# Patient Record
Sex: Male | Born: 1965 | Race: White | Hispanic: No | Marital: Married | Smoking: Never smoker
Health system: Southern US, Community
[De-identification: ages and names within clinical notes are randomized; demographics above are authoritative.]

## PROBLEM LIST (undated history)

## (undated) DIAGNOSIS — I10 Essential (primary) hypertension: Secondary | ICD-10-CM

## (undated) DIAGNOSIS — D689 Coagulation defect, unspecified: Secondary | ICD-10-CM

## (undated) DIAGNOSIS — I82409 Acute embolism and thrombosis of unspecified deep veins of unspecified lower extremity: Secondary | ICD-10-CM

## (undated) DIAGNOSIS — S86019A Strain of unspecified Achilles tendon, initial encounter: Secondary | ICD-10-CM

## (undated) DIAGNOSIS — E785 Hyperlipidemia, unspecified: Secondary | ICD-10-CM

## (undated) DIAGNOSIS — T7840XA Allergy, unspecified, initial encounter: Secondary | ICD-10-CM

## (undated) HISTORY — DX: Coagulation defect, unspecified: D68.9

## (undated) HISTORY — PX: FOOT SURGERY: SHX648

## (undated) HISTORY — DX: Hyperlipidemia, unspecified: E78.5

## (undated) HISTORY — DX: Allergy, unspecified, initial encounter: T78.40XA

## (undated) HISTORY — DX: Essential (primary) hypertension: I10

## (undated) HISTORY — PX: ACHILLES TENDON REPAIR: SUR1153

## (undated) HISTORY — DX: Strain of unspecified achilles tendon, initial encounter: S86.019A

## (undated) HISTORY — DX: Acute embolism and thrombosis of unspecified deep veins of unspecified lower extremity: I82.409

---

## 2000-09-14 ENCOUNTER — Emergency Department (HOSPITAL_COMMUNITY): Admission: EM | Admit: 2000-09-14 | Discharge: 2000-09-14 | Payer: Self-pay | Admitting: Emergency Medicine

## 2000-09-24 ENCOUNTER — Encounter: Payer: Self-pay | Admitting: Gastroenterology

## 2000-09-24 ENCOUNTER — Ambulatory Visit (HOSPITAL_COMMUNITY): Admission: RE | Admit: 2000-09-24 | Discharge: 2000-09-24 | Payer: Self-pay | Admitting: Gastroenterology

## 2001-11-18 ENCOUNTER — Encounter: Payer: Self-pay | Admitting: Specialist

## 2001-11-18 ENCOUNTER — Observation Stay (HOSPITAL_COMMUNITY): Admission: RE | Admit: 2001-11-18 | Discharge: 2001-11-19 | Payer: Self-pay | Admitting: Specialist

## 2004-03-12 ENCOUNTER — Ambulatory Visit: Payer: Self-pay | Admitting: Internal Medicine

## 2005-07-04 ENCOUNTER — Ambulatory Visit: Payer: Self-pay | Admitting: Internal Medicine

## 2005-08-19 ENCOUNTER — Ambulatory Visit: Payer: Self-pay | Admitting: Internal Medicine

## 2005-11-21 ENCOUNTER — Ambulatory Visit: Payer: Self-pay | Admitting: Internal Medicine

## 2007-01-30 ENCOUNTER — Ambulatory Visit: Payer: Self-pay | Admitting: Internal Medicine

## 2008-02-10 DIAGNOSIS — E785 Hyperlipidemia, unspecified: Secondary | ICD-10-CM | POA: Insufficient documentation

## 2008-02-10 DIAGNOSIS — J4540 Moderate persistent asthma, uncomplicated: Secondary | ICD-10-CM | POA: Insufficient documentation

## 2008-02-11 ENCOUNTER — Ambulatory Visit: Payer: Self-pay | Admitting: Internal Medicine

## 2008-03-11 ENCOUNTER — Ambulatory Visit: Payer: Self-pay | Admitting: Pulmonary Disease

## 2008-03-11 ENCOUNTER — Telehealth (INDEPENDENT_AMBULATORY_CARE_PROVIDER_SITE_OTHER): Payer: Self-pay | Admitting: *Deleted

## 2008-03-11 DIAGNOSIS — J111 Influenza due to unidentified influenza virus with other respiratory manifestations: Secondary | ICD-10-CM | POA: Insufficient documentation

## 2008-03-14 ENCOUNTER — Telehealth (INDEPENDENT_AMBULATORY_CARE_PROVIDER_SITE_OTHER): Payer: Self-pay | Admitting: *Deleted

## 2008-03-14 ENCOUNTER — Ambulatory Visit: Payer: Self-pay | Admitting: Internal Medicine

## 2008-03-14 DIAGNOSIS — J069 Acute upper respiratory infection, unspecified: Secondary | ICD-10-CM | POA: Insufficient documentation

## 2008-03-17 ENCOUNTER — Telehealth (INDEPENDENT_AMBULATORY_CARE_PROVIDER_SITE_OTHER): Payer: Self-pay | Admitting: *Deleted

## 2008-07-28 DIAGNOSIS — S86019A Strain of unspecified Achilles tendon, initial encounter: Secondary | ICD-10-CM

## 2008-07-28 HISTORY — DX: Strain of unspecified achilles tendon, initial encounter: S86.019A

## 2008-08-17 ENCOUNTER — Ambulatory Visit (HOSPITAL_COMMUNITY): Admission: RE | Admit: 2008-08-17 | Discharge: 2008-08-18 | Payer: Self-pay | Admitting: Orthopedic Surgery

## 2008-08-25 ENCOUNTER — Ambulatory Visit: Payer: Self-pay | Admitting: Internal Medicine

## 2008-08-25 ENCOUNTER — Telehealth (INDEPENDENT_AMBULATORY_CARE_PROVIDER_SITE_OTHER): Payer: Self-pay | Admitting: *Deleted

## 2008-08-29 LAB — CONVERTED CEMR LAB
INR: 2.4 — ABNORMAL HIGH (ref 0.8–1.0)
Prothrombin Time: 24.9 s — ABNORMAL HIGH (ref 10.9–13.3)

## 2008-09-01 ENCOUNTER — Ambulatory Visit: Payer: Self-pay | Admitting: Internal Medicine

## 2008-09-01 LAB — CONVERTED CEMR LAB
INR: 3.4 — ABNORMAL HIGH (ref 0.8–1.0)
Prothrombin Time: 34.2 s — ABNORMAL HIGH (ref 10.9–13.3)

## 2008-09-08 ENCOUNTER — Ambulatory Visit: Payer: Self-pay | Admitting: Internal Medicine

## 2008-09-09 LAB — CONVERTED CEMR LAB
INR: 4 — ABNORMAL HIGH (ref 0.8–1.0)
Prothrombin Time: 39.9 s — ABNORMAL HIGH (ref 10.9–13.3)

## 2008-09-13 ENCOUNTER — Encounter (INDEPENDENT_AMBULATORY_CARE_PROVIDER_SITE_OTHER): Payer: Self-pay | Admitting: *Deleted

## 2008-09-13 ENCOUNTER — Ambulatory Visit: Payer: Self-pay | Admitting: Internal Medicine

## 2008-09-13 LAB — CONVERTED CEMR LAB
INR: 1.1 — ABNORMAL HIGH (ref 0.8–1.0)
Prothrombin Time: 11.5 s (ref 10.9–13.3)

## 2008-10-27 DIAGNOSIS — I82409 Acute embolism and thrombosis of unspecified deep veins of unspecified lower extremity: Secondary | ICD-10-CM

## 2008-10-27 HISTORY — DX: Acute embolism and thrombosis of unspecified deep veins of unspecified lower extremity: I82.409

## 2008-11-14 ENCOUNTER — Encounter: Payer: Self-pay | Admitting: Internal Medicine

## 2008-11-14 ENCOUNTER — Ambulatory Visit: Payer: Self-pay | Admitting: Cardiology

## 2008-11-14 ENCOUNTER — Ambulatory Visit: Payer: Self-pay | Admitting: Vascular Surgery

## 2008-11-14 ENCOUNTER — Encounter (INDEPENDENT_AMBULATORY_CARE_PROVIDER_SITE_OTHER): Payer: Self-pay | Admitting: Orthopedic Surgery

## 2008-11-14 ENCOUNTER — Ambulatory Visit (HOSPITAL_COMMUNITY): Admission: RE | Admit: 2008-11-14 | Discharge: 2008-11-14 | Payer: Self-pay | Admitting: Orthopedic Surgery

## 2008-11-14 ENCOUNTER — Encounter (INDEPENDENT_AMBULATORY_CARE_PROVIDER_SITE_OTHER): Payer: Self-pay | Admitting: *Deleted

## 2008-11-14 LAB — CONVERTED CEMR LAB
POC INR: 0.9
Prothrombin Time: 11.6 s

## 2008-11-15 ENCOUNTER — Telehealth: Payer: Self-pay | Admitting: Internal Medicine

## 2008-11-17 ENCOUNTER — Ambulatory Visit: Payer: Self-pay | Admitting: *Deleted

## 2008-11-17 ENCOUNTER — Ambulatory Visit: Payer: Self-pay | Admitting: Internal Medicine

## 2008-11-21 ENCOUNTER — Ambulatory Visit: Payer: Self-pay | Admitting: Cardiology

## 2008-11-21 LAB — CONVERTED CEMR LAB: Prothrombin Time: 15 s

## 2008-11-24 ENCOUNTER — Telehealth: Payer: Self-pay | Admitting: Internal Medicine

## 2008-11-25 ENCOUNTER — Ambulatory Visit: Payer: Self-pay | Admitting: Cardiology

## 2008-12-02 ENCOUNTER — Ambulatory Visit: Payer: Self-pay | Admitting: Cardiology

## 2008-12-02 LAB — CONVERTED CEMR LAB
POC INR: 3.6
Prothrombin Time: 22.9 s

## 2008-12-09 ENCOUNTER — Encounter (INDEPENDENT_AMBULATORY_CARE_PROVIDER_SITE_OTHER): Payer: Self-pay | Admitting: Cardiology

## 2008-12-09 ENCOUNTER — Ambulatory Visit: Payer: Self-pay | Admitting: Internal Medicine

## 2008-12-09 LAB — CONVERTED CEMR LAB: POC INR: 2.1

## 2008-12-23 ENCOUNTER — Ambulatory Visit: Payer: Self-pay | Admitting: Internal Medicine

## 2008-12-23 LAB — CONVERTED CEMR LAB: POC INR: 2.7

## 2009-01-20 ENCOUNTER — Telehealth: Payer: Self-pay | Admitting: Internal Medicine

## 2009-01-20 ENCOUNTER — Ambulatory Visit: Payer: Self-pay | Admitting: Internal Medicine

## 2009-02-17 ENCOUNTER — Ambulatory Visit: Payer: Self-pay | Admitting: Internal Medicine

## 2009-03-17 ENCOUNTER — Ambulatory Visit: Payer: Self-pay | Admitting: Cardiology

## 2009-03-17 LAB — CONVERTED CEMR LAB: POC INR: 1.8

## 2009-03-21 ENCOUNTER — Telehealth (INDEPENDENT_AMBULATORY_CARE_PROVIDER_SITE_OTHER): Payer: Self-pay | Admitting: *Deleted

## 2009-03-21 ENCOUNTER — Ambulatory Visit: Payer: Self-pay | Admitting: Internal Medicine

## 2009-03-21 DIAGNOSIS — I82409 Acute embolism and thrombosis of unspecified deep veins of unspecified lower extremity: Secondary | ICD-10-CM

## 2009-03-21 DIAGNOSIS — Z86718 Personal history of other venous thrombosis and embolism: Secondary | ICD-10-CM | POA: Insufficient documentation

## 2009-04-07 ENCOUNTER — Encounter: Payer: Self-pay | Admitting: Internal Medicine

## 2009-04-10 ENCOUNTER — Ambulatory Visit: Payer: Self-pay

## 2009-04-10 ENCOUNTER — Encounter: Payer: Self-pay | Admitting: Internal Medicine

## 2009-04-10 ENCOUNTER — Encounter (INDEPENDENT_AMBULATORY_CARE_PROVIDER_SITE_OTHER): Payer: Self-pay | Admitting: Cardiology

## 2009-04-10 ENCOUNTER — Ambulatory Visit: Payer: Self-pay | Admitting: Cardiovascular Disease

## 2009-04-12 ENCOUNTER — Telehealth (INDEPENDENT_AMBULATORY_CARE_PROVIDER_SITE_OTHER): Payer: Self-pay | Admitting: *Deleted

## 2009-04-14 ENCOUNTER — Ambulatory Visit: Payer: Self-pay | Admitting: Internal Medicine

## 2009-04-14 DIAGNOSIS — I10 Essential (primary) hypertension: Secondary | ICD-10-CM | POA: Insufficient documentation

## 2009-04-14 LAB — CONVERTED CEMR LAB
BUN: 13 mg/dL (ref 6–23)
Creatinine, Ser: 1.3 mg/dL (ref 0.4–1.5)
Eosinophils Relative: 2.5 % (ref 0.0–5.0)
GFR calc non Af Amer: 64.54 mL/min (ref >60)
Lymphocytes Relative: 34.2 % (ref 12.0–46.0)
Monocytes Relative: 8.3 % (ref 3.0–12.0)
Neutrophils Relative %: 54.5 % (ref 43.0–77.0)
Platelets: 168 10*3/uL (ref 150.0–400.0)
RBC: 5.25 M/uL (ref 4.22–5.81)
WBC: 5.1 10*3/uL (ref 4.5–10.5)

## 2009-05-08 ENCOUNTER — Encounter (INDEPENDENT_AMBULATORY_CARE_PROVIDER_SITE_OTHER): Payer: Self-pay | Admitting: Cardiology

## 2009-05-08 ENCOUNTER — Ambulatory Visit: Payer: Self-pay | Admitting: Cardiology

## 2009-05-08 LAB — CONVERTED CEMR LAB: POC INR: 2

## 2009-05-15 ENCOUNTER — Ambulatory Visit: Payer: Self-pay | Admitting: Internal Medicine

## 2009-05-15 DIAGNOSIS — R42 Dizziness and giddiness: Secondary | ICD-10-CM | POA: Insufficient documentation

## 2009-05-29 ENCOUNTER — Telehealth: Payer: Self-pay | Admitting: Internal Medicine

## 2009-06-05 ENCOUNTER — Ambulatory Visit: Payer: Self-pay | Admitting: Cardiology

## 2009-06-05 LAB — CONVERTED CEMR LAB: POC INR: 2.1

## 2009-06-26 ENCOUNTER — Ambulatory Visit: Payer: Self-pay | Admitting: Internal Medicine

## 2009-06-26 LAB — CONVERTED CEMR LAB
Chloride: 102 meq/L (ref 96–112)
Creatinine, Ser: 1.4 mg/dL (ref 0.4–1.5)
Potassium: 4 meq/L (ref 3.5–5.1)
Sodium: 137 meq/L (ref 135–145)

## 2009-07-03 ENCOUNTER — Ambulatory Visit: Payer: Self-pay | Admitting: Internal Medicine

## 2009-07-03 LAB — CONVERTED CEMR LAB: POC INR: 2

## 2009-09-15 ENCOUNTER — Ambulatory Visit: Payer: Self-pay | Admitting: Cardiology

## 2009-09-15 LAB — CONVERTED CEMR LAB: POC INR: 1.8

## 2009-10-10 ENCOUNTER — Telehealth (INDEPENDENT_AMBULATORY_CARE_PROVIDER_SITE_OTHER): Payer: Self-pay | Admitting: *Deleted

## 2009-10-10 ENCOUNTER — Ambulatory Visit: Payer: Self-pay | Admitting: Adult Health

## 2009-10-10 ENCOUNTER — Ambulatory Visit: Payer: Self-pay | Admitting: Radiology

## 2009-10-10 ENCOUNTER — Ambulatory Visit (HOSPITAL_BASED_OUTPATIENT_CLINIC_OR_DEPARTMENT_OTHER): Admission: RE | Admit: 2009-10-10 | Discharge: 2009-10-10 | Payer: Self-pay | Admitting: Internal Medicine

## 2009-10-10 DIAGNOSIS — S70919A Unspecified superficial injury of unspecified hip, initial encounter: Secondary | ICD-10-CM | POA: Insufficient documentation

## 2009-10-10 DIAGNOSIS — S90919A Unspecified superficial injury of unspecified ankle, initial encounter: Secondary | ICD-10-CM

## 2009-10-10 DIAGNOSIS — S80929A Unspecified superficial injury of unspecified lower leg, initial encounter: Secondary | ICD-10-CM

## 2009-10-10 DIAGNOSIS — S70929A Unspecified superficial injury of unspecified thigh, initial encounter: Secondary | ICD-10-CM

## 2009-10-11 ENCOUNTER — Ambulatory Visit: Payer: Self-pay | Admitting: Internal Medicine

## 2009-10-11 ENCOUNTER — Encounter: Payer: Self-pay | Admitting: Adult Health

## 2009-10-11 ENCOUNTER — Encounter: Payer: Self-pay | Admitting: Internal Medicine

## 2009-10-11 ENCOUNTER — Ambulatory Visit: Payer: Self-pay

## 2009-10-11 LAB — CONVERTED CEMR LAB
POC INR: 1.5
Prothrombin Time: 19.6 s — ABNORMAL HIGH (ref 11.6–15.2)

## 2009-10-24 ENCOUNTER — Telehealth (INDEPENDENT_AMBULATORY_CARE_PROVIDER_SITE_OTHER): Payer: Self-pay | Admitting: *Deleted

## 2009-10-24 ENCOUNTER — Ambulatory Visit: Payer: Self-pay | Admitting: Adult Health

## 2009-11-07 ENCOUNTER — Ambulatory Visit: Payer: Self-pay | Admitting: Internal Medicine

## 2009-12-18 ENCOUNTER — Ambulatory Visit: Payer: Self-pay | Admitting: Cardiology

## 2010-01-15 ENCOUNTER — Ambulatory Visit: Payer: Self-pay | Admitting: Internal Medicine

## 2010-01-15 LAB — CONVERTED CEMR LAB: POC INR: 1.3

## 2010-02-15 ENCOUNTER — Ambulatory Visit: Payer: Self-pay | Admitting: Internal Medicine

## 2010-02-15 LAB — CONVERTED CEMR LAB: POC INR: 2.5

## 2010-03-16 ENCOUNTER — Telehealth (INDEPENDENT_AMBULATORY_CARE_PROVIDER_SITE_OTHER): Payer: Self-pay | Admitting: *Deleted

## 2010-05-25 ENCOUNTER — Ambulatory Visit: Admission: RE | Admit: 2010-05-25 | Discharge: 2010-05-25 | Payer: Self-pay | Source: Home / Self Care

## 2010-05-25 LAB — CONVERTED CEMR LAB: POC INR: 2.9

## 2010-05-29 NOTE — Medication Information (Signed)
Summary: rov/tm  Anticoagulant Therapy  Managed by: Shelby Dubin, PharmD, BCPS, CPP Referring MD: wert MD, Casimiro Needle PCP: Nolon Lennert MD: Jens Som MD, Arlys John Indication 1: DVT Lab Used: [facility] [place] Site: [address] INR POC 2.0 INR RANGE 2-3  Dietary changes: no    Health status changes: yes       Details: continues to experience light headedness..seeing MW back next week.  Bleeding/hemorrhagic complications: no    Recent/future hospitalizations: no    Any changes in medication regimen? yes       Details: added diovan hct for BP  Recent/future dental: no  Any missed doses?: yes     Details: missed 1 dose 2 weeks ago.    Is patient compliant with meds? yes       Current Medications (verified): 1)  Symbicort 160-4.5 Mcg/act Aero (Budesonide-Formoterol Fumarate) .... 2 Puffs First Thing  in Am and 2 Puffs Again in Pm About 12 Hours Later 2)  Proair Hfa 108 (90 Base) Mcg/act Aers (Albuterol Sulfate) .... As Needed 3)  Coumadin 5 Mg Tabs (Warfarin Sodium) .... Take As Directed By Coumadin Clinic. 4)  Tylenol 325 Mg Tabs (Acetaminophen) .... As Needed For Headaches 5)  Viagra 50 Mg Tabs (Sildenafil Citrate) .... Take One Tab One Hour Before Activity 6)  Diovan Hct 160-12.5 Mg  Tabs (Valsartan-Hydrochlorothiazide) .... One By Mouth Daily  Allergies (verified): No Known Drug Allergies  Anticoagulation Management History:      The patient is taking warfarin and comes in today for a routine follow up visit.  Negative risk factors for bleeding include an age less than 8 years old.  The bleeding index is 'low risk'.  Positive CHADS2 values include History of HTN.  Negative CHADS2 values include Age > 65 years old.  His last INR was 2.8 ratio.  Anticoagulation responsible provider: Jens Som MD, Arlys John.  INR POC: 2.0.  Cuvette Lot#: [id number].  Exp: 05/2010.    Anticoagulation Management Assessment/Plan:      The patient's current anticoagulation dose is  Coumadin 5 mg tabs: Take as directed by coumadin clinic..  The target INR is 2.0-3.0.  The next INR is due 06/05/2009.  Anticoagulation instructions were given to patient.  Results were reviewed/authorized by Shelby Dubin, PharmD, BCPS, CPP.  He was notified by Shelby Dubin PharmD, BCPS, CPP.         Prior Anticoagulation Instructions: INR 2.1  Continue 0.5 tab Monday and Friday and 1 tab on all other days.    Recheck in 4 weeks.    Current Anticoagulation Instructions: INR 2.0  Take 0.5 tab extra today, then 1 tab daily except 0.5 tab Monday and Friday.    Recheck in 4 weeks.

## 2010-05-29 NOTE — Medication Information (Signed)
Summary: rov/eac  Anticoagulant Therapy  Managed by: Bethena Midget, RN, BSN Referring MD: wert MD, Casimiro Needle PCP: Nolon Lennert MD: Tenny Craw MD, Gunnar Fusi Indication 1: DVT Lab Used: [facility] [place] Site: [address] INR POC 2.0 INR RANGE 2-3  Dietary changes: no    Health status changes: yes       Details: Saw Dr. Sherene Sires last week, has been bothered by dizziness. States his B/P was fine. Antivert added.   Bleeding/hemorrhagic complications: yes       Details: bruise noted on rt. upper forearm  Recent/future hospitalizations: no    Any changes in medication regimen? yes       Details:  Antivert added PRN  Recent/future dental: no  Any missed doses?: yes     Details: Missed last Tues. dose.   Is patient compliant with meds? yes       Allergies: No Known Drug Allergies  Anticoagulation Management History:      The patient is taking warfarin and comes in today for a routine follow up visit.  Negative risk factors for bleeding include an age less than 37 years old.  The bleeding index is 'low risk'.  Positive CHADS2 values include History of HTN.  Negative CHADS2 values include Age > 59 years old.  His last INR was 2.8 ratio.  Anticoagulation responsible provider: Tenny Craw MD, Gunnar Fusi.  INR POC: 2.0.  Cuvette Lot#: [id number].  Exp: 08/2010.    Anticoagulation Management Assessment/Plan:      The patient's current anticoagulation dose is Coumadin 5 mg tabs: Take as directed by coumadin clinic..  The target INR is 2.0-3.0.  The next INR is due 08/07/2009.  Anticoagulation instructions were given to patient.  Results were reviewed/authorized by Bethena Midget, RN, BSN.  He was notified by Bethena Midget, RN, BSN.         Prior Anticoagulation Instructions: INR 2.1  Continue current dosing schedule.  TAke 1/2 tablet on MOnday and Friday, and take 1 tablet all other days.  Return to clinic in 4 weeks.  Current Anticoagulation Instructions: INR 2.0 Continue 5mg s daily except  2.5mg s on Mondays and Fridays. Recheck in 4 weeks.  Prescriptions: COUMADIN 5 MG TABS (WARFARIN SODIUM) Take as directed by coumadin clinic.  #45 x 3   Entered by:   Bethena Midget, RN, BSN   Authorized by:   Sherrill Raring, MD, [facility]   Signed by:   Bethena Midget, RN, BSN on 07/03/2009   Method used:   Electronically to        [facility]. [phone]* (retail)       [phone] [address]       [place], [place]  [address]       Ph: [phone]       Fax: [phone]   RxID:   [id number]

## 2010-05-29 NOTE — Progress Notes (Signed)
Summary: samples  Phone Note Call from Patient Call back at Home Phone [phone]   Caller: Patient Call For: Omara Alcon Reason for Call: Talk to Nurse, Privacy/Consent Authorization Summary of Call: was put on BP meds, came to np.  Went off diovan to see if he could go without it.  He can't and wants to know if he can get samples of diovan, before his next appt w / MW.. Initial call taken by: Eugene Gavia,  May 29, 2009 4:04 PM  Follow-up for Phone Call        called and spoke with pt and he stated that his bp readings have been elevated---152/100 since stopping the diovan--pt requesting that he be given samples of the diovan 160/12.5 which is what he was on before.  has appt with MW om feb 28th.  please advise.  thanks Randell Loop CMA  May 29, 2009 4:28 PM  ok with me Follow-up by: Nyoka Cowden MD,  May 29, 2009 4:53 PM  Additional Follow-up for Phone Call Additional follow up Details #1::        pt is aware of samples left up front and he will be by in the am to pick these up Randell Loop [facility]  May 29, 2009 5:05 PM

## 2010-05-29 NOTE — Assessment & Plan Note (Signed)
Summary: Primary/ f/u ov for dizziness   Primary Provider/Referring Provider:  Sherene Sires  CC:  6 wk followup.  Pt states that BP has been better with diovan.  Marland Kitchen  History of Present Illness: 12 yowm never smoker  with a history of chidlhod asthma & status asthmaticus requiring mechanical ventilation 4/97 at [facility].   He typically uses  Symbicort, more on an as-needed basis rather than every day. He has only needed albuterol a few times in the past year.   March 21, 2009 c/o  "still feels clot in leg =  leg gets warm but not painful, never swells followed in coumadin clinic and wants to consider stopping after 6 months of rx.  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  May 15, 2009--Returns for 2 week follow up for HTN after rx with diovan.  June 26, 2009 ov 6 wk followup.  Pt states that BP has been better with diovan.  HA less, veritigo when rolls over in bed, no tinnitus, no ear aches,  no nausea. Pt denies any significant sore throat, dysphagia, itching, sneezing,  nasal congestion or excess secretions,  fever, chills, sweats, unintended wt loss, pleuritic or exertional cp, hempoptysis, change in activity tolerance  orthopnea pnd or leg swelling   Current Medications (verified): 1)  Symbicort 160-4.5 Mcg/act Aero (Budesonide-Formoterol Fumarate) .... 2 Puffs First Thing  in Am and 2 Puffs Again in Pm About 12 Hours Later 2)  Proair Hfa 108 (90 Base) Mcg/act Aers (Albuterol Sulfate) .... As Needed 3)  Coumadin 5 Mg Tabs (Warfarin Sodium) .... Take As Directed By Coumadin Clinic. 4)  Tylenol 325 Mg Tabs (Acetaminophen) .... As Needed For Headaches 5)  Viagra 50 Mg Tabs (Sildenafil Citrate) .... Take One Tab One Hour Before Activity 6)  Diovan Hct 160-12.5 Mg Tabs (Valsartan-Hydrochlorothiazide) .Marland Kitchen.. 1 Once Daily  Allergies (verified): No Known Drug Allergies  Past History:  Past Medical History: HYPERLIPIDEMIA (ICD-272.4) ASTHMA (ICD-493.90) HEALTH MAINTENANCE     - Pneumovax   02/2004 (second shot) Ruptured achilles April 2010 DVT Left leg after achilles surgery July 2010      - Repeat doppler 04/10/09:    Extensive chronic L Leg DVT  Vital Signs:  Patient profile:   45 year old male Weight:      215 pounds O2 Sat:      99 % on Room air Temp:     97.7 degrees F oral Pulse rate:   75 / minute BP sitting:   120 / 82  (left arm) BP standing:   128 / 76  (left arm)  Vitals Entered By: Vernie Murders (June 26, 2009 9:24 AM)  O2 Flow:  Room air  Physical Exam  Additional Exam:  wt  209 March 14, 2008  > 206 March 21, 2009 > 206 April 14, 2009 >>216 May 15, 2009 > 215 June 26, 2009  Ambulatory healthy appearing in no acute distress but a bit anxious, no orthostatic bp changes HEENT: nl dentition, and orophanx. mod non specific turbinate edema Neck without JVD/Nodes/TM Lungs trace exp wheeze without cough on insp or exp maneuvers RRR no s3 or murmur or increase in P2 Abd soft and benign with nl excursion in the supine position. No bruits or organomegaly Ext warm without calf tenderness, cyanosis clubbing or edema, no obvious dvt Skin warm and dry without lesions   Neuro: intact   Impression & Recommendations:  Problem # 1:  HYPERTENSION, BENIGN (ICD-401.1) ok on rx His updated medication list for  this problem includes:    Diovan Hct 160-12.5 Mg Tabs (Valsartan-hydrochlorothiazide) .Marland Kitchen... 1 once daily  Orders: TLB-BMP (Basic Metabolic Panel-BMET) (80048-METABOL) Est. 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  See instructions for specific recommendations   Orders: Est. Patient Level III (66063)  Medications Added to Medication List This Visit: 1)  Diovan Hct 160-12.5 Mg Tabs (Valsartan-hydrochlorothiazide) .Marland Kitchen.. 1 once daily 2)  Antivert 25 Mg Tabs (Meclizine hcl) .... One every 6 hours as needed  Patient Instructions: 1)  Antivert can be used as needed for  dizziness, try avoiding those activities that provoke it.  2)  Return to office in 3 months, sooner if needed  Prescriptions: ANTIVERT 25 MG TABS (MECLIZINE HCL) one every 6 hours as needed  #30 x 11   Entered and Authorized by:   Nyoka Cowden MD   Signed by:   Nyoka Cowden MD on 06/26/2009   Method used:   Electronically to        [facility]. [phone]* (retail)       [phone] [address].       [place], [place]  [address]       Ph: [phone]       Fax: [phone]   RxID:   [id number] DIOVAN HCT 160-12.5 MG TABS (VALSARTAN-HYDROCHLOROTHIAZIDE) 1 once daily  #34 x 11   Entered and Authorized by:   Nyoka Cowden MD   Signed by:   Nyoka Cowden MD on 06/26/2009   Method used:   Electronically to        [facility]. [phone]* (retail)       [phone] [address].       [place], [place]  [address]       Ph: [phone]       Fax: [phone]   RxID:   [id number]

## 2010-05-29 NOTE — Medication Information (Signed)
Summary: rov/tm  Anticoagulant Therapy  Managed by: Eda Keys, PharmD Referring MD: wert MD, Casimiro Needle PCP: Nolon Lennert MD: Myrtis Ser MD, Tinnie Gens Indication 1: DVT Lab Used: [facility] [place] Site: [address] INR POC 1.8 INR RANGE 2-3  Dietary changes: no    Health status changes: no    Bleeding/hemorrhagic complications: no    Recent/future hospitalizations: no    Any changes in medication regimen? no    Recent/future dental: no  Any missed doses?: yes     Details: missed at least one dose in past 2 weeks.   Is patient compliant with meds? yes       Allergies: No Known Drug Allergies  Anticoagulation Management History:      The patient is taking warfarin and comes in today for a routine follow up visit.  Negative risk factors for bleeding include an age less than 23 years old.  The bleeding index is 'low risk'.  Positive CHADS2 values include History of HTN.  Negative CHADS2 values include Age > 40 years old.  His last INR was 2.8 ratio.  Anticoagulation responsible provider: Myrtis Ser MD, Tinnie Gens.  INR POC: 1.8.  Cuvette Lot#: [id number].  Exp: 11/2010.    Anticoagulation Management Assessment/Plan:      The patient's current anticoagulation dose is Coumadin 5 mg tabs: Take as directed by coumadin clinic..  The target INR is 2.0-3.0.  The next INR is due 10/13/2009.  Anticoagulation instructions were given to patient.  Results were reviewed/authorized by Eda Keys, PharmD.  He was notified by Eda Keys.         Prior Anticoagulation Instructions: INR 2.0 Continue 5mg s daily except 2.5mg s on Mondays and Fridays. Recheck in 4 weeks.   Current Anticoagulation Instructions: INR 1.8  Take 1 tablet today.  Then return to normal dosing schedule of 1/2 tablet on  Monday and Friday and 1 tablet all other days.  Return to clinic in 4 weeks.

## 2010-05-29 NOTE — Medication Information (Signed)
Summary: rov/mw  Anticoagulant Therapy  Managed by: Eda Keys, PharmD Referring MD: wert MD, Casimiro Needle PCP: Nolon Lennert MD: Gala Romney MD, Reuel Boom Indication 1: DVT Lab Used: [facility] [place] Site: [address] INR POC 2.5 INR RANGE 2-3  Dietary changes: no    Health status changes: no    Bleeding/hemorrhagic complications: no    Recent/future hospitalizations: no    Any changes in medication regimen? no    Recent/future dental: no  Any missed doses?: no       Is patient compliant with meds? yes      Comments: Recommended that patient return in 4 weeks, but he is unable to come any sooner than 03/26/10 due to work schedule.   Allergies: No Known Drug Allergies  Anticoagulation Management History:      The patient is taking warfarin and comes in today for a routine follow up visit.  Negative risk factors for bleeding include an age less than 14 years old.  The bleeding index is 'low risk'.  Positive CHADS2 values include History of HTN.  Negative CHADS2 values include Age > 45 years old.  His last INR was 2.12.  Anticoagulation responsible provider: Bensimhon MD, Reuel Boom.  INR POC: 2.5.  Cuvette Lot#: [id number].  Exp: 02/2011.    Anticoagulation Management Assessment/Plan:      The patient's current anticoagulation dose is Coumadin 5 mg tabs: Take as directed by coumadin clinic..  The target INR is 2.0-3.0.  The next INR is due 03/26/2010.  Anticoagulation instructions were given to patient.  Results were reviewed/authorized by Eda Keys, PharmD.  He was notified by Haynes Hoehn, PharmD Candidate.         Prior Anticoagulation Instructions: INR 1.3  We need to boost you to make up for missed doses. Today take 1 and half tablets instead of half a tablet. Tomorrow and wednesday take 1.5 tablets instead of 1. See you in 4 weeks.  Current Anticoagulation Instructions: INR 2.5  Continue Coumadin as scheduled:  1 tablet every day fo the week,  except 1/2 tablet on Monday and Friday.  Return to clinic on 03/26/2010.

## 2010-05-29 NOTE — Assessment & Plan Note (Signed)
Summary: NP follow up - HTN   Primary Provider/Referring Provider:  Sherene Sires  CC:  2 week follow up for HTN.  states has been doing well on diovan.  c/o dizziness unchanged.Marland Kitchen  History of Present Illness: 69 yowm never smoker  with a history of chidlhod asthma & status asthmaticus requiring mechanical ventilation 4/97 at [facility].   He typically uses  Symbicort, more on an as-needed basis rather than every day. He has only needed albuterol a few times in the past year.   March 21, 2009 c/o  "still feels clot in leg =  leg gets warm but not painful, never swells followed in coumadin clinic and wants to consider stopping after 6 months of rx.  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  April 14, 2009 cc new onset 04/09/2009 stopped nose, aches all over, light headed  prod cough with clear mucus after returned by car from [place] where exposed in a meeting to a sick colleage. Restarted daily symbicort and felt better.  Then 12/14  three different locations acute onset  waxed and waned over several days then resolved and breathing fine now.    Main concern is intermittent lightheadedness when stand.     May 15, 2009--Returns for 2 week follow up for HTN.  states has been doing well on diovan.  c/o dizziness unchanged.  He has finished diovan samples, off for 2 days. We discussed dangers of HTN, and med education. He does not want to be on meds and wants to try lifestyle changes. b/p is improved today-(no meds x 2 days). He is very active and travels - gave him pt ed sheets on diet and HTN.  Feels lightheaded if he stands and bends over, mild. No associated speech/visual changes, headache, ext. weakness, or palpitations. Does not drink alot of fluids and has alcohol 4 x /wk. Denies chest pain, dyspnea, orthopnea, hemoptysis, fever, n/v/d, edema, headache.   Medications Prior to Update: 1)  Symbicort 160-4.5 Mcg/act Aero (Budesonide-Formoterol Fumarate) .... 2 Puffs First Thing  in Am and 2 Puffs Again in  Pm About 12 Hours Later 2)  Proair Hfa 108 (90 Base) Mcg/act Aers (Albuterol Sulfate) .... As Needed 3)  Coumadin 5 Mg Tabs (Warfarin Sodium) .... Take As Directed By Coumadin Clinic. 4)  Tylenol 325 Mg Tabs (Acetaminophen) .... As Needed For Headaches 5)  Viagra 50 Mg Tabs (Sildenafil Citrate) .... Take One Tab One Hour Before Activity 6)  Diovan Hct 160-12.5 Mg  Tabs (Valsartan-Hydrochlorothiazide) .... One By Mouth Daily  Current Medications (verified): 1)  Symbicort 160-4.5 Mcg/act Aero (Budesonide-Formoterol Fumarate) .... 2 Puffs First Thing  in Am and 2 Puffs Again in Pm About 12 Hours Later 2)  Proair Hfa 108 (90 Base) Mcg/act Aers (Albuterol Sulfate) .... As Needed 3)  Coumadin 5 Mg Tabs (Warfarin Sodium) .... Take As Directed By Coumadin Clinic. 4)  Tylenol 325 Mg Tabs (Acetaminophen) .... As Needed For Headaches 5)  Viagra 50 Mg Tabs (Sildenafil Citrate) .... Take One Tab One Hour Before Activity 6)  Diovan Hct 160-12.5 Mg  Tabs (Valsartan-Hydrochlorothiazide) .... One By Mouth Daily  Allergies (verified): No Known Drug Allergies  Past History:  Past Medical History: Last updated: 04/14/2009 HYPERLIPIDEMIA (ICD-272.4) ASTHMA (ICD-493.90) HEALTH MAINTENANCE     - Pneumovax  02/2004 (second shot) Ruptured achilles April 2010 DVT Left leg after achilles surgery July 2010      - Repeat doppler 04/10/09:  Extensive chronic L Leg DVT  Family History: Last updated: 02/10/2008 Unknown  family history; pt is adopted.  Social History: Last updated: 05/15/2009 Patient never smoked.  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  states has been doing well on diovan.  c/o dizziness unchanged. Is Patient Diabetic? No Comments Medications reviewed with patient Daytime contact number verified with patient. Boone Master CNA  May 15, 2009 9:45 AM    Physical Exam  Additional Exam:  wt  209 March 14, 2008  > 206 March 21, 2009 > 206 April 14, 2009 >>216 May 15, 2009  Ambulatory healthy appearing in no acute distress but a bit anxious HEENT: nl dentition, and orophanx. mod non specific turbinate edema Neck without JVD/Nodes/TM Lungs trace exp wheeze without cough on insp or exp maneuvers RRR no s3 or murmur or increase in P2 Abd soft and benign with nl excursion in the supine position. No bruits or organomegaly Ext warm without calf tenderness, cyanosis clubbing or edema, no obvious dvt Skin warm and dry without lesions   Neuro: CN 2-12 intact, head manuevrs without reporducible symp. no nystagmus, nml gait, equal strenth, PERRLA, EOMI    Impression & Recommendations:  Problem # 1:  HYPERTENSION, BENIGN (ICD-401.1)  b/p improved today.  he declines meds for now. wants 6 week trial with lifestyle changes he has agreed to call back if b/p elevated >150/100  will keep log and return in 6 weeks for review.  kidney fnx sl elevated, pt advised to increase fluids, decrease salt.  cont to follow.  The following medications were removed from the medication list:    Diovan Hct 160-12.5 Mg Tabs (Valsartan-hydrochlorothiazide) ..... One by mouth daily  BP today: 134/78 Prior BP: 130/110 (04/14/2009)  Labs Reviewed: K+: 3.6 (04/14/2009) Creat: : 1.3 (04/14/2009)     Orders: Est. Patient Level IV (01751)  Problem # 2:  POSTURAL LIGHTHEADEDNESS  (ICD-780.4)  ?etiology, exam unrevealing and labs unremarkable.  may be due to inadequate fluid intake,  advised to increase fluids.  if continues will need further eval.   Orders: Est. Patient Level IV (02585)  Complete Medication List: 1)  Symbicort 160-4.5 Mcg/act Aero (Budesonide-formoterol fumarate) .... 2 puffs first thing  in am and 2 puffs again in pm about 12 hours later 2)  Proair Hfa 108 (90 Base) Mcg/act Aers (Albuterol sulfate) .... As needed 3)  Coumadin 5 Mg Tabs (Warfarin sodium) .... Take as directed by coumadin clinic. 4)  Tylenol 325 Mg Tabs (Acetaminophen) .... As needed for headaches 5)  Viagra 50 Mg Tabs (Sildenafil citrate) .... Take one tab one hour before activity  Patient Instructions: 1)  Check blood pressure 3 times week , keep log 2)  call if blood pressure >150/100  3)  Low salt diet, cholestrol, fat.  4)  Advance exercise as tolerated.  5)  follow up 6 weeks Dr. Sherene Sires and as needed  6)  Please contact office for sooner follow up if symptoms do not improve or worsen

## 2010-05-29 NOTE — Medication Information (Signed)
Summary: rov/sp   Anticoagulant Therapy  Managed by: Lyna Poser, PharmD Referring MD: wert MD, Casimiro Needle PCP: Nolon Lennert MD: Antoine Poche MD, Fayrene Fearing Indication 1: DVT Lab Used: [facility] [place] Site: [address] INR POC 1.3 INR RANGE 2-3  Dietary changes: no    Health status changes: no    Bleeding/hemorrhagic complications: no    Recent/future hospitalizations: no    Any changes in medication regimen? no    Recent/future dental: no  Any missed doses?: yes     Details: may have missed up to 3 doses  Is patient compliant with meds? yes      Comments: Patient refused to come back in 7 to 10 days. Will only come back in 1 month. Counseled on importance of coming back much sooner to get it rechecked and he refused.   Allergies: No Known Drug Allergies  Anticoagulation Management History:      Negative risk factors for bleeding include an age less than 39 years old.  The bleeding index is 'low risk'.  Positive CHADS2 values include History of HTN.  Negative CHADS2 values include Age > 63 years old.  His last INR was 2.12.  Anticoagulation responsible provider: Antoine Poche MD, Fayrene Fearing.  INR POC: 1.3.  Exp: 02/2011.    Anticoagulation Management Assessment/Plan:      The patient's current anticoagulation dose is Coumadin 5 mg tabs: Take as directed by coumadin clinic..  The target INR is 2.0-3.0.  The next INR is due 02/12/2010.  Anticoagulation instructions were given to patient.  Results were reviewed/authorized by Lyna Poser, PharmD.         Prior Anticoagulation Instructions: INR 2.5  Continue same dose of 1 tablet every day except 1/2 tablet on Monday and Friday.  Recheck INR in 4 weeks.   Current Anticoagulation Instructions: INR 1.3  We need to boost you to make up for missed doses. Today take 1 and half tablets instead of half a tablet. Tomorrow and wednesday take 1.5 tablets instead of 1. See you in 4 weeks.

## 2010-05-29 NOTE — Assessment & Plan Note (Signed)
Summary: Acute NP visit left ankle edema   Visit Type:  Acute NP visit Primary Provider/Referring Provider:  Sherene Sires  CC:  Pt c/o left ankle swelling x 2 weeks after being hit a weight. Pt states only uses Symbicort prn and and has not had Diovan since Friday.  History of Present Illness: 51 yowm never smoker  with a history of chidlhod asthma & status asthmaticus requiring mechanical ventilation 4/97 at [facility].   He typically uses  Symbicort, more on an as-needed basis rather than every day. He has only needed albuterol a few times in the past year.   March 21, 2009 c/o  "still feels clot in leg =  leg gets warm but not painful, never swells followed in coumadin clinic and wants to consider stopping after 6 months of rx.  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  May 15, 2009--Returns for 2 week follow up for HTN after rx with diovan.  June 26, 2009 ov 6 wk followup.  Pt states that BP has been better with diovan.  HA less, veritigo when rolls over in bed, no tinnitus, no ear aches,  no nausea.    October 10, 2009--Presents for an acute office visit. Pt c/o left ankle swelling x 2 weeks after being hit with a weight. Pt has a hx of DVT in left leg s/p achilles tendon repair last year. follow up Doppler in 12/10 showed chronic clot. He is currently on coumadin. He was outside with dog 2 weeks ago and dog was tied to weight , dog took off and 25lb weight rolled into pt left lower leg and ankle. It is now swollen from foot to knee. Mild eccymosis noted. Last INR was 1.8. He denies chest pain or dyspnea. Lower leg tender. No significant pain w/ weight bearing. Denies chest pain, dyspnea, orthopnea, hemoptysis, fever, n/v/d.,   Medications Prior to Update: 1)  Symbicort 160-4.5 Mcg/act Aero (Budesonide-Formoterol Fumarate) .... 2 Puffs First Thing  in Am and 2 Puffs Again in Pm About 12 Hours Later 2)  Proair Hfa 108 (90 Base) Mcg/act Aers (Albuterol Sulfate) .... As Needed 3)  Coumadin 5 Mg  Tabs (Warfarin Sodium) .... Take As Directed By Coumadin Clinic. 4)  Tylenol 325 Mg Tabs (Acetaminophen) .... As Needed For Headaches 5)  Viagra 50 Mg Tabs (Sildenafil Citrate) .... Take One Tab One Hour Before Activity 6)  Diovan Hct 160-12.5 Mg Tabs (Valsartan-Hydrochlorothiazide) .Marland Kitchen.. 1 Once Daily 7)  Antivert 25 Mg Tabs (Meclizine Hcl) .... One Every 6 Hours As Needed  Current Medications (verified): 1)  Symbicort 160-4.5 Mcg/act Aero (Budesonide-Formoterol Fumarate) .... 2 Puffs First Thing  in Am and 2 Puffs Again in Pm About 12 Hours Later 2)  Proair Hfa 108 (90 Base) Mcg/act Aers (Albuterol Sulfate) .... As Needed 3)  Coumadin 5 Mg Tabs (Warfarin Sodium) .... Take As Directed By Coumadin Clinic. 4)  Tylenol 325 Mg Tabs (Acetaminophen) .... As Needed For Headaches 5)  Viagra 50 Mg Tabs (Sildenafil Citrate) .... Take One Tab One Hour Before Activity 6)  Diovan Hct 160-12.5 Mg Tabs (Valsartan-Hydrochlorothiazide) .Marland Kitchen.. 1 Once Daily 7)  Antivert 25 Mg Tabs (Meclizine Hcl) .... One Every 6 Hours As Needed  Allergies (verified): No Known Drug Allergies  Past History:  Past Medical History: Last updated: 06/26/2009 HYPERLIPIDEMIA (ICD-272.4) ASTHMA (ICD-493.90) HEALTH MAINTENANCE     - Pneumovax  02/2004 (second shot) Ruptured achilles April 2010 DVT Left leg after achilles surgery July 2010      - Repeat  doppler 04/10/09:    Extensive chronic L Leg DVT  Family History: Last updated: 02/10/2008 Unknown family history; pt is adopted.  Social History: Last updated: 05/15/2009 Patient never smoked.  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 Pt states only uses Symbicort prn, and has not had Diovan since Friday Is Patient Diabetic? No Comments Medications reviewed with patient Verified contact number and pharmacy with patient Zackery Barefoot [facility]  October 10, 2009 2:54 PM    Physical Exam  Additional Exam:  wt  209 March 14, 2008  > 206 March 21, 2009 > 206 April 14, 2009 >>216 May 15, 2009 > 215 June 26, 2009  Ambulatory healthy appearing in no acute distress but a bit anxious, no orthostatic bp changes HEENT: nl dentition, and orophanx. mod non specific turbinate edema Neck without JVD/Nodes/TM Lungs trace exp wheeze without cough on insp or exp maneuvers RRR no s3 or murmur or increase in P2 Abd soft and benign with nl excursion in the supine position. No bruits or organomegaly Ext left lower leg w/ edema from foot to knee, no obvious hematoma, ankle rom nml. tender to touch along medial aspect. neg homans sign' mild dependent eccymosis along medial /plantar surface of foot.  Skin warm and dry without lesions   Neuro: intact   Impression & Recommendations:  Problem # 1:  INJURY, LEG (ICD-916.8)  s/p 25lb weight hitting left lower leg/ankle. hx of left DVT dx in 2010, follow up doppler in 12/10 showed chronic clot.  will redoppler to evaluate if clot is still present. will be able to decide on coumadin at this point as well.  xray pending to r/o fx. highly doubt.  REC:  Keep legs elevated.  Tylenol as needed pain  Alternate w/ Warm compresses  and ice as needed  I will call with xray and doppler results. Vickie Epley  follow up Dr. Sherene Sires in 4 weeks.  Please contact office for sooner follow up if symptoms do not improve or worsen   Orders: T-Tib/Fib Left (73590TC) T-Ankle Comp Left Min 3 Views (73610TC) Doppler Referral  (Doppler) Est. Patient Level IV (16109)  Complete Medication List: 1)  Symbicort 160-4.5 Mcg/act Aero (Budesonide-formoterol fumarate) .... 2 puffs first thing  in am and 2 puffs again in pm about 12 hours later 2)  Proair Hfa 108 (90 Base) Mcg/act Aers (Albuterol sulfate) .... As needed 3)  Coumadin 5 Mg Tabs (Warfarin sodium) .... Take as directed by coumadin clinic. 4)  Tylenol 325 Mg Tabs (Acetaminophen) .... As needed for headaches 5)  Viagra 50 Mg Tabs (Sildenafil citrate) .... Take one tab one hour before activity 6)  Diovan Hct 160-12.5 Mg Tabs (Valsartan-hydrochlorothiazide) .Marland Kitchen.. 1 once daily 7)  Antivert 25 Mg Tabs (  Meclizine hcl) .... One every 6 hours as needed  Other Orders: TLB-PT (Protime) (85610-PTP)  Patient Instructions: 1)  Keep legs elevated.  2)  Tylenol as needed pain  3)  Alternate w/ Warm compresses  and ice as needed  4)  I will call with xray and doppler results. Vickie Epley  5)  follow up Dr. Sherene Sires in 4 weeks.  6)  Please contact office for sooner follow up if symptoms do not improve or worsen      Appended Document: venous doppler PMH udpdate       Past History:  Past Medical History: HYPERLIPIDEMIA (ICD-272.4) ASTHMA (ICD-493.90) HEALTH MAINTENANCE     - Pneumovax  02/2004 (second shot) Ruptured achilles April 2010 DVT Left leg after achilles surgery July 2010      - Repeat doppler 04/10/09:    Extensive chronic L Leg DVT      -repeat doppler 10/11/09---resolution of DVT in CFV and SFV, residual clot in popliteal and peroneal veins --coumadin conintued

## 2010-05-29 NOTE — Medication Information (Signed)
Summary: [email or web address]  Anticoagulant Therapy  Managed by: Eda Keys, PharmD Referring MD: wert MD, Casimiro Needle PCP: Nolon Lennert MD: Excell Seltzer MD, Casimiro Needle Indication 1: DVT Lab Used: [facility] [place] Site: [address] INR POC 2.1 INR RANGE 2-3  Dietary changes: no    Health status changes: no    Bleeding/hemorrhagic complications: no    Recent/future hospitalizations: no    Any changes in medication regimen? yes       Details: recent addition of diovan  Recent/future dental: no  Any missed doses?: yes     Details: possibly missed one dose  Is patient compliant with meds? yes       Allergies: No Known Drug Allergies  Anticoagulation Management History:      The patient is taking warfarin and comes in today for a routine follow up visit.  Negative risk factors for bleeding include an age less than 56 years old.  The bleeding index is 'low risk'.  Positive CHADS2 values include History of HTN.  Negative CHADS2 values include Age > 49 years old.  His last INR was 2.8 ratio.  Anticoagulation responsible provider: Excell Seltzer MD, Casimiro Needle.  INR POC: 2.1.  Cuvette Lot#: [id number].  Exp: 07/2010.    Anticoagulation Management Assessment/Plan:      The patient's current anticoagulation dose is Coumadin 5 mg tabs: Take as directed by coumadin clinic..  The target INR is 2.0-3.0.  The next INR is due 07/03/2009.  Anticoagulation instructions were given to patient.  Results were reviewed/authorized by Eda Keys, PharmD.  He was notified by Eda Keys.         Prior Anticoagulation Instructions: INR 2.0  Take 0.5 tab extra today, then 1 tab daily except 0.5 tab Monday and Friday.    Recheck in 4 weeks.    Current Anticoagulation Instructions: INR 2.1  Continue current dosing schedule.  TAke 1/2 tablet on MOnday and Friday, and take 1 tablet all other days.  Return to clinic in 4 weeks.

## 2010-05-29 NOTE — Letter (Signed)
Summary: Handout Printed  Printed Handout:  - Coumadin Instructions-w/out Meds 

## 2010-05-29 NOTE — Assessment & Plan Note (Signed)
Summary: Acute NP office - left thigh pain   Primary Provider/Referring Provider:  Sherene Sires  CC:  "pulled his hamstring on L leg" this past Saturday while trying to catch a ball playing baseball.  Pt states he is taking acetaminophen and also using heat and alternating with ice on his leg but still c/o pain.  Pt states he has "2 knots" on his leg where he pulled his muscle.  Pt states he is able to extend and flex his leg but states it's "excrutiating".  History of Present Illness: 71 yowm never smoker  with a history of chidlhod asthma & status asthmaticus requiring mechanical ventilation 4/97 at [facility].   He typically uses  Symbicort, more on an as-needed basis rather than every day. He has only needed albuterol a few times in the past year.   March 21, 2009 c/o  "still feels clot in leg =  leg gets warm but not painful, never swells followed in coumadin clinic and wants to consider stopping after 6 months of rx.  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  May 15, 2009--Returns for 2 week follow up for HTN after rx with diovan.  June 26, 2009 ov 6 wk followup.  Pt states that BP has been better with diovan.  HA less, veritigo when rolls over in bed, no tinnitus, no ear aches,  no nausea.    October 10, 2009--Presents for an acute office visit. Pt c/o left ankle swelling x 2 weeks after being hit with a weight. Pt has a hx of DVT in left leg s/p achilles tendon repair last year. follow up Doppler in 12/10 showed chronic clot. He is currently on coumadin. He was outside with dog 2 weeks ago and dog was tied to weight , dog took off and 25lb weight rolled into pt left lower leg and ankle. It is now swollen from foot to knee. Mild eccymosis noted. Last INR was 1.8. He denies chest pain or dyspnea. Lower leg tender. No significant pain w/ weight bearing. Denies chest pain, dyspnea, orthopnea, hemoptysis, fever, n/v/d.,    October 24, 2009-Presents for work in visit. Complains that he "pulled his  hamstring on L leg" this past Saturday while trying to catch a ball playing baseball.  Pt states he is taking acetaminophen and also using heat and alternating with ice on his leg but still c/o pain.  Pt states he has "2 knots" on his leg where he pulled his muscle.  Pt states he is able to extend and flex his leg but states it's "excrutiating". Ran to catch foul ball felt pop in back of leg. Painful to bear weight Has swelling in thigh. Was seen 2 weeks ago for lower leg injury w/ weight. Underwent venous doppler which showed improved clot resolution in thigh but chronic clot remained in popliteal/peroneal. His lower leg edema has much improved and is back to baseline. Denies chest pain, dyspnea, orthopnea, hemoptysis, fever, n/v/d, headache. He remains on coumadin. Last INR 1.8. Has follow up later this week.   Medications Prior to Update: 1)  Symbicort 160-4.5 Mcg/act Aero (Budesonide-Formoterol Fumarate) .... 2 Puffs First Thing  in Am and 2 Puffs Again in Pm About 12 Hours Later 2)  Proair Hfa 108 (90 Base) Mcg/act Aers (Albuterol Sulfate) .... As Needed 3)  Coumadin 5 Mg Tabs (Warfarin Sodium) .... Take As Directed By Coumadin Clinic. 4)  Tylenol 325 Mg Tabs (Acetaminophen) .... As Needed For Headaches 5)  Viagra 50 Mg Tabs (Sildenafil Citrate) .Marland KitchenMarland KitchenMarland Kitchen  Take One Tab One Hour Before Activity 6)  Diovan Hct 160-12.5 Mg Tabs (Valsartan-Hydrochlorothiazide) .Marland Kitchen.. 1 Once Daily 7)  Antivert 25 Mg Tabs (Meclizine Hcl) .... One Every 6 Hours As Needed  Current Medications (verified): 1)  Symbicort 160-4.5 Mcg/act Aero (Budesonide-Formoterol Fumarate) .... 2 Puffs First Thing  in Am and 2 Puffs Again in Pm About 12 Hours Later 2)  Proair Hfa 108 (90 Base) Mcg/act Aers (Albuterol Sulfate) .... As Needed 3)  Coumadin 5 Mg Tabs (Warfarin Sodium) .... Take As Directed By Coumadin Clinic. 4)  Tylenol 325 Mg Tabs (Acetaminophen) .... As Needed For Headaches 5)  Viagra 50 Mg Tabs (Sildenafil Citrate) .... Take One  Tab One Hour Before Activity 6)  Diovan Hct 160-12.5 Mg Tabs (Valsartan-Hydrochlorothiazide) .Marland Kitchen.. 1 Once Daily 7)  Antivert 25 Mg Tabs (Meclizine Hcl) .... One Every 6 Hours As Needed  Allergies (verified): No Known Drug Allergies  Past History:  Family History: Last updated: 02/10/2008 Unknown family history; pt is adopted.  Social History: Last updated: 05/15/2009 Patient never smoked.  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 (02/11/2008)  Past Medical History: HYPERLIPIDEMIA (ICD-272.4) ASTHMA (ICD-493.90) HEALTH MAINTENANCE     - Pneumovax  02/2004 (second shot) Ruptured achilles April 2010 DVT Left leg after achilles surgery July 2010      - Repeat doppler 04/10/09:    Extensive chronic L Leg DVT      -repeat doppler 10/11/09---resolution of DVT in CFV and SFV, residual clot in popliteal and peroneal veins      -coumadin conintued   Review of Systems      See HPI  Vital Signs:  Patient profile:   45 year old male Height:      72 inches Weight:      218 pounds BMI:     29.67 O2 Sat:      96 % on Room air Temp:     98.3 degrees F oral Pulse rate:   79 / minute BP sitting:   124 / 80  (left arm) Cuff size:   regular  Vitals Entered By: Boone Master CNA/MA (October 24, 2009 3:27 PM)  O2 Flow:  Room air CC: "pulled his hamstring on L leg" this past Saturday while trying to catch a ball playing baseball.  Pt states he is taking acetaminophen and also using heat and alternating with ice on his leg but still c/o pain.  Pt states he has "2 knots" on his leg where he pulled his muscle.  Pt states he is able to extend and flex his leg but states it's "excrutiating" Is Patient Diabetic? No Comments Medications reviewed with patient Daytime contact number verified with patient. Boone Master CNA/MA  October 24, 2009 3:25 PM    Physical Exam  Additional Exam:  wt  209 March 14, 2008  > 206 March 21, 2009 > 206 April 14, 2009 >>216 May 15, 2009 > 215 June 26, 2009 >>218 October 24, 2009  Ambulatory healthy appearing in no acute distress  HEENT: nl dentition, and orophanx.  Neck without JVD/Nodes/TM Lungs trace exp wheeze without cough on insp or exp maneuvers RRR no s3 or murmur or increase in P2 Abd soft and benign with nl excursion in the supine position. No bruits or organomegaly Ext left lower leg w/ edema resolved. from foot to knee, Hematoma along upper thigh/groin and mid/proximal thigh, swelling of thigh noted.  pulses intact 2+. extremity is warm  Skin warm and dry without lesions   Neuro: intact   Impression & Recommendations:  Problem # 1:  INJURY, LEG (ICD-916.8) s/p hamstring injury with associated hematoma in pt on coumadin.  will check PT/INR.  He is having quite abit of pain in hamstring -will refer to ortho Dr. Darrelyn Hillock to eval.  also will want to cont to montior for swelling or expansion hematoma  follow up 2 weeks Dr. Sherene Sires as scheduled may use warm compresses , rest, no sports acticvity, elevation  may use vicodin as needed pain.   Orders: Est. Patient Level IV (16109)  Medications Added to Medication List This Visit: 1)  Vicodin 5-500 Mg Tabs (Hydrocodone-acetaminophen) .Marland Kitchen.. 1 by mouth every 4-6 hr as needed pain  Complete Medication List: 1)  Symbicort 160-4.5 Mcg/act Aero (Budesonide-formoterol fumarate) .... 2 puffs first thing  in am and 2 puffs again in pm about 12 hours later 2)  Proair Hfa 108 (90 Base) Mcg/act Aers (Albuterol sulfate) .... As needed 3)  Coumadin 5 Mg Tabs (Warfarin sodium) .... Take as directed by coumadin clinic. 4)  Tylenol 325 Mg Tabs (Acetaminophen) .... As needed for headaches 5)  Viagra 50 Mg Tabs (Sildenafil citrate) .... Take one tab one hour before activity 6)  Diovan Hct 160-12.5 Mg Tabs (Valsartan-hydrochlorothiazide) .Marland Kitchen.. 1 once daily 7)  Antivert 25 Mg Tabs (Meclizine hcl) .... One every 6 hours as needed 8)   Vicodin 5-500 Mg Tabs (Hydrocodone-acetaminophen) .Marland Kitchen.. 1 by mouth every 4-6 hr as needed pain  Patient Instructions: 1)  Keep legs elevated, rest, warm compresses to leg.  2)  I will call with labs.  3)  We are referring you to Dr Darrelyn Hillock office to evaluate.  4)  May use Vicodin as needed severe pain, may make you sleepy.  5)  ABSOLUTELY NO SPORTS UNTIL THIS RESOLVES.  6)  Please contact office for sooner follow up if symptoms do not improve or worsen  7)  follow up Dr. Sherene Sires as scheduled in 2 weeks.  Prescriptions: VICODIN 5-500 MG TABS (HYDROCODONE-ACETAMINOPHEN) 1 by mouth every 4-6 hr as needed pain  #20 x 0   Entered and Authorized by:   Rubye Oaks NP   Signed by:   Tammy Parrett NP on 10/24/2009   Method used:   Print then Give to Patient   RxID:   [id number]   Appended Document: Acute NP office - left thigh pain   Appended Document: Acute NP office - left thigh pain faxed via biscom to Dr. Darrelyn Hillock per TP's request.

## 2010-05-29 NOTE — Progress Notes (Signed)
Summary: rx  Phone Note Call from Patient Call back at Home Phone [phone]   Caller: Patient Call For: wert Reason for Call: Talk to Nurse Summary of Call: pulled muscle from baseball game over w/e.  Can you call in a muscle relaxer?  At the back of pt's thigh.  Has an upcoming appt. [facility] [address]. Initial call taken by: Eugene Gavia,  October 24, 2009 1:15 PM  Follow-up for Phone Call        pt was recently seen by TP on 10-10-2009.  Pt c/o "pulling his hamstring on L leg" this past Saturday while trying to catch a ball playing baseball.  Pt states he is taking acetaminophen and also using heat and alternating with ice on his leg but still c/o pain.  Pt states he has "2 knots" on his leg where he pulled his muscle.  Pt states he is able to extend and flex his leg but states it's very sore.  Pt would like rx for muscle relaxer called into pharmcy.  MW not in office this PM.  Will forward message to TP to address.  Thanks.  Aundra Millet Reynolds LPN  October 24, 2009 2:03 PM   Additional Follow-up for Phone Call Additional follow up Details #1::        I do not use muscle relaxers for legs, warm heat and rest is best.  he has hx of DVT,and is on coumadin, make sure not hematoma w/ increased swelling, may need work in visit.   Additional Follow-up by: Rubye Oaks NP,  October 24, 2009 2:39 PM    Additional Follow-up for Phone Call Additional follow up Details #2::    Spoke with pt and notified of recs per TP.  Pt states that his leg is swollen and so I sched him to see TP in [place] this afternoon at 3:15 pm. Follow-up by: Vernie Murders,  October 24, 2009 2:46 PM

## 2010-05-29 NOTE — Progress Notes (Signed)
Summary: talk to nurse  Phone Note Call from Patient Call back at Home Phone [phone]   Caller: Patient Call For: wert Summary of Call: Legs swelling x 2 weeks, pls advise. Initial call taken by: Darletta Moll,  October 10, 2009 10:54 AM  Follow-up for Phone Call        Spoke with pt.  He states that he has had swelling in hIs ankles x 2 wks.  Seems to be getting worse.  Not red or painful or warm to touch.  Pt has hx of DVT.  OV with TP sched at our [place] location for 3 pm.  Gave pt directions to office.   Follow-up by: Vernie Murders,  October 10, 2009 11:06 AM

## 2010-05-29 NOTE — Medication Information (Signed)
Summary: rov/eac  Anticoagulant Therapy  Managed by: Bethena Midget, RN, BSN Referring MD: wert MD, Casimiro Needle PCP: Nolon Lennert MD: Gala Romney MD, Reuel Boom Indication 1: DVT Lab Used: [facility] of [facility] [place] Site: [address] INR POC 1.5 INR RANGE 2-3  Dietary changes: no    Health status changes: no    Bleeding/hemorrhagic complications: no    Recent/future hospitalizations: no    Any changes in medication regimen? no    Recent/future dental: no  Any missed doses?: yes     Details: last week missed dose and also yesterday's dose  Is patient compliant with meds? yes       Allergies: No Known Drug Allergies  Anticoagulation Management History:      The patient is taking warfarin and comes in today for a routine follow up visit.  Negative risk factors for bleeding include an age less than 23 years old.  The bleeding index is 'low risk'.  Positive CHADS2 values include History of HTN.  Negative CHADS2 values include Age > 27 years old.  His last INR was 1.67.  Anticoagulation responsible provider: Petrice Beedy MD, Reuel Boom.  INR POC: 1.5.  Cuvette Lot#: [id number].  Exp: 11/2010.    Anticoagulation Management Assessment/Plan:      The patient's current anticoagulation dose is Coumadin 5 mg tabs: Take as directed by coumadin clinic..  The target INR is 2.0-3.0.  The next INR is due 10/27/2009.  Anticoagulation instructions were given to patient.  Results were reviewed/authorized by Bethena Midget, RN, BSN.  He was notified by Bethena Midget, RN, BSN.         Prior Anticoagulation Instructions: INR 1.8  Take 1 tablet today.  Then return to normal dosing schedule of 1/2 tablet on  Monday and Friday and 1 tablet all other days.  Return to clinic in 4 weeks.    Current Anticoagulation Instructions: INR 1.5 Today extra 5mg s Then resume 5mg s everyday except 2.5mg s on Mondays and Fridays. Recheck in 2 weeks.

## 2010-05-29 NOTE — Miscellaneous (Signed)
Summary: Orders Update  Clinical Lists Changes  Orders: Added new Test order of Venous Duplex Lower Extremity (Venous Duplex Lower) - Signed 

## 2010-05-29 NOTE — Assessment & Plan Note (Signed)
Summary: Pulmonary/ f/u DVT/ asthma   Primary Provider/Referring Provider:  Sherene Sires  CC:  4 wk followup.  Pt still c/o leg left foot edema.  He also c/o pain in left leg from where he pulled hamstring x 2 wks ago.Jose Crane  History of Present Illness: 45 yowm never smoker  with a history of chidlhod asthma & status asthmaticus requiring mechanical ventilation 4/97 at [facility].   He typically uses  Symbicort, more on an as-needed basis rather than every day. He has only needed albuterol a few times in the past year.   March 21, 2009 c/o  "still feels clot in leg =  leg gets warm but not painful, never swells followed in coumadin clinic and wants to consider stopping after 6 months of rx.  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  May 15, 2009--Returns for 2 week follow up for HTN after rx with diovan.  June 26, 2009 ov 6 wk followup.  Pt states that BP has been better with diovan.  HA less, veritigo when rolls over in bed, no tinnitus, no ear aches,  no nausea.    October 10, 2009--Presents for an acute office visit. Pt c/o left ankle swelling x 2 weeks after being hit with a weight. Pt has a hx of DVT in left leg s/p achilles tendon repair last year. follow up Doppler in 12/10 showed chronic clot. He is currently on coumadin. He was outside with dog 2 weeks ago and dog was tied to weight , dog took off and 25lb weight rolled into pt left lower leg and ankle. It is now swollen from foot to knee. Mild eccymosis noted. Last INR was 1.8. He denies chest pain or dyspnea. Lower leg tender. No significant pain w/ weight bearing. Denies chest pain, dyspnea, orthopnea, hemoptysis, fever, n/v/d.,   October 24, 2009-Presents for work in visit. Complains that he "pulled his hamstring on L leg" this past Saturday while trying to catch a ball playing baseball.  Pt states he is taking acetaminophen and also using heat and alternating with ice on his leg but still c/o pain.  Pt states he has "2 knots" on his leg where he  pulled his muscle.  Pt states he is able to extend and flex his leg but states it's "excrutiating". Ran to catch foul ball felt pop in back of leg. Painful to bear weight Has swelling in thigh. Was seen 2 weeks ago for lower leg injury w/ weight. Underwent venous doppler which showed improved clot resolution in thigh but chronic clot remained in popliteal/peroneal. His lower leg edema has much improved and is back to baseline.Jose Crane He remains on coumadin. Last INR 1.8. Has follow up later this week.  rec orthopedic eval.   November 07, 2009 ov 4 wk followup.  Pt still c/o leg left foot edema.  He also c/o pain in left leg from where he pulled hamstring x 2 wks ago. leg swelling was better when elevated, worse once back down.  no cp, sob.  Current Medications (verified): 1)  Symbicort 160-4.5 Mcg/act Aero (Budesonide-Formoterol Fumarate) .... 2 Puffs First Thing  in Am and 2 Puffs Again in Pm About 12 Hours Later 2)  Proair Hfa 108 (90 Base) Mcg/act Aers (Albuterol Sulfate) .... As Needed 3)  Coumadin 5 Mg Tabs (Warfarin Sodium) .... Take As Directed By Coumadin Clinic. 4)  Tylenol 325 Mg Tabs (Acetaminophen) .... As Needed For Headaches 5)  Viagra 50 Mg Tabs (Sildenafil Citrate) .... Take One Tab One  Hour Before Activity 6)  Vicodin 5-500 Mg Tabs (Hydrocodone-Acetaminophen) .Jose [name]. 1 By Mouth Every 4-6 Hr As Needed Pain 7)  Robaxin 500 Mg Tabs (Methocarbamol) .... As Directed As Needed 8)  Percocet 5-325 Mg Tabs (Oxycodone-Acetaminophen) .Jose [name]. 1 Every 6 Hours As Needed  Allergies (verified): No Known Drug Allergies  Past History:  Past Medical History: HYPERLIPIDEMIA (ICD-272.4) ASTHMA (ICD-493.90) HEALTH MAINTENANCE     - Pneumovax  02/2004 (second shot) Ruptured achilles April 2010 DVT Left leg after achilles surgery July 2010      - Repeat doppler 04/10/09:  Extensive chronic L Leg DVT      -repeat doppler 10/11/09---resolution of DVT in CFV and SFV, residual clot in popliteal and peroneal veins         Vital Signs:  Patient profile:   45 year old male Weight:      222 pounds O2 Sat:      97 % on Room air Temp:     98.1 degrees F oral Pulse rate:   60 / minute BP sitting:   120 / 90  (left arm)  Vitals Entered By: Vernie Murders (November 07, 2009 11:40 AM)  O2 Flow:  Room air  Physical Exam  Additional Exam:  wt  209 March 14, 2008  > 216 May 15, 2009 > 215 June 26, 2009 >>218 October 24, 2009 > 222 November 07, 2009  Ambulatory healthy appearing in no acute distress  HEENT: nl dentition, and orophanx.  Neck without JVD/Nodes/TM Lungs trace exp wheeze without cough on insp or exp maneuvers RRR no s3 or murmur or increase in P2 Abd soft and benign with nl excursion in the supine position. No bruits or organomegaly Ext left lower leg w/ edema resolved. from foot to knee, Hematoma along upper thigh/groin and mid/proximal thigh, swelling of thigh noted.  pulses intact 2+. extremity is warm     Impression & Recommendations:  Problem # 1:  DEEP VENOUS THROMBOPHLEBITIS (ICD-453.40)  Most likely has venous insufficiency with high risk swelling/ clotting if not taking good care of the leg and maintained on adequate anticoagulation indefinitely.  Rec teds See instructions for specific recommendations   Orders: Est. Patient Level III ([address])  Problem # 2:  ASTHMA (ICD-493.90) All goals of asthma met including optimal function and elimination of symptoms with minimum need for rescue therapy. Contingencies discussed today including the rule of two's.   Medications Added to Medication List This Visit: 1)  Robaxin 500 Mg Tabs (Methocarbamol) .... As directed as needed 2)  Percocet 5-325 Mg Tabs (Oxycodone-acetaminophen) .Jose [name]. 1 every 6 hours as needed  Patient Instructions: 1)  Keep the left leg elevated as much as possible - your venous valves are not working well 2)  TED Hose are effective to prevent pooling (works similar to ace wrap)

## 2010-05-29 NOTE — Progress Notes (Signed)
Summary: coughing  Phone Note Call from Patient Call back at Home Phone [phone]   Caller: Patient Call For: wert Summary of Call: Pt c/o coughing up yellow/greenish mucous x1wk, wants an abx called in pls advise.//[facility] Initial call taken by: Darletta Moll,  March 16, 2010 3:28 PM  Follow-up for Phone Call        Spoke with pt.  He is c/o prod cough with green/yellow sputum x 1 wk.  He states that he has no other symptoms.  Wants abx called in.  I offered ov with TP but he refused, stating that he does not feel well enough to come in, but would followup next wk if needed.  TP, pls advise thanks! Follow-up by: Vernie Murders,  March 16, 2010 3:32 PM  Additional Follow-up for Phone Call Additional follow up Details #1::        Omnicef 300mg  two times a day for 7 days --take with food. #14, no refills  Mucienx DM two times a day as needed cough/congestion  Please contact office for sooner follow up if symptoms do not improve or worsen  Additional Follow-up by: Rubye Oaks NP,  March 16, 2010 3:37 PM    Additional Follow-up for Phone Call Additional follow up Details #2::    Spoke with pt and notified of recs per TP.  Pt verbalized understanding.  Rx was [facility].  New/Updated Medications: CEFDINIR 300 MG CAPS (CEFDINIR) 1 by mouth two times a day with meals until gone Prescriptions: CEFDINIR 300 MG CAPS (CEFDINIR) 1 by mouth two times a day with meals until gone  #14 x 0   Entered by:   Vernie Murders   Authorized by:   Rubye Oaks NP   Signed by:   Vernie Murders on 03/16/2010   Method used:   Electronically to        [facility]. [phone]* (retail)       [id number]       [place], [place]  [address]       Ph: [phone]       Fax: [phone]   RxID:   [id number]

## 2010-05-29 NOTE — Medication Information (Signed)
Summary: rov/tm  Anticoagulant Therapy  Managed by: Weston Brass, PharmD Referring MD: wert MD, Casimiro Needle PCP: Sherene Sires Supervising MD: Antoine Poche MD, Fayrene Fearing Indication 1: DVT Lab Used: [facility] [place] Site: [address] INR POC 2.5 INR RANGE 2-3  Dietary changes: no    Health status changes: no    Bleeding/hemorrhagic complications: no    Recent/future hospitalizations: no    Any changes in medication regimen? no    Recent/future dental: no  Any missed doses?: yes     Details: missed 1-2 doses a few weeks ago  Is patient compliant with meds? yes       Current Medications (verified): 1)  Symbicort 160-4.5 Mcg/act Aero (Budesonide-Formoterol Fumarate) .... 2 Puffs First Thing  in Am and 2 Puffs Again in Pm About 12 Hours Later 2)  Proair Hfa 108 (90 Base) Mcg/act Aers (Albuterol Sulfate) .... As Needed 3)  Coumadin 5 Mg Tabs (Warfarin Sodium) .... Take As Directed By Coumadin Clinic. 4)  Tylenol 325 Mg Tabs (Acetaminophen) .... As Needed For Headaches 5)  Viagra 50 Mg Tabs (Sildenafil Citrate) .... Take One Tab One Hour Before Activity  Allergies: No Known Drug Allergies  Anticoagulation Management History:      The patient is taking warfarin and comes in today for a routine follow up visit.  Negative risk factors for bleeding include an age less than 32 years old.  The bleeding index is 'low risk'.  Positive CHADS2 values include History of HTN.  Negative CHADS2 values include Age > 47 years old.  His last INR was 2.12.  Anticoagulation responsible Jovita Persing: Antoine Poche MD, Fayrene Fearing.  INR POC: 2.5.  Cuvette Lot#: [id number].  Exp: 02/2011.    Anticoagulation Management Assessment/Plan:      The patient's current anticoagulation dose is Coumadin 5 mg tabs: Take as directed by coumadin clinic..  The target INR is 2.0-3.0.  The next INR is due 01/15/2010.  Anticoagulation instructions were given to patient.  Results were reviewed/authorized by Weston Brass, PharmD.  He was  notified by Weston Brass PharmD.         Prior Anticoagulation Instructions: INR 1.5 Today extra 5mg s Then resume 5mg s everyday except 2.5mg s on Mondays and Fridays. Recheck in 2 weeks.   Current Anticoagulation Instructions: INR 2.5  Continue same dose of 1 tablet every day except 1/2 tablet on Monday and Friday.  Recheck INR in 4 weeks.

## 2010-05-31 NOTE — Medication Information (Signed)
Summary: rov/tp   Anticoagulant Therapy  Managed by: Geoffry Paradise, PharmD Referring MD: wert MD, Casimiro Needle PCP: Sherene Sires Supervising MD: Melburn Popper Indication 1: DVT Lab Used: [facility] [place] Site: [address] INR POC 2.9 INR RANGE 2-3  Dietary changes: no    Health status changes: no    Bleeding/hemorrhagic complications: no    Recent/future hospitalizations: no    Any changes in medication regimen? no    Recent/future dental: no  Any missed doses?: yes     Details: May have missed a dose 1-2 weeks ago  Is patient compliant with meds? yes       Allergies: No Known Drug Allergies  Anticoagulation Management History:      The patient is taking warfarin and comes in today for a routine follow up visit.  Negative risk factors for bleeding include an age less than 42 years old.  The bleeding index is 'low risk'.  Positive CHADS2 values include History of HTN.  Negative CHADS2 values include Age > 100 years old.  His last INR was 2.12.  Anticoagulation responsible provider: Nasher.  INR POC: 2.9.  Cuvette Lot#: [id number].  Exp: 04/2011.    Anticoagulation Management Assessment/Plan:      The patient's current anticoagulation dose is Coumadin 5 mg tabs: Take as directed by coumadin clinic..  The target INR is 2.0-3.0.  The next INR is due 06/22/2010.  Anticoagulation instructions were given to patient.  Results were reviewed/authorized by Geoffry Paradise, PharmD.         Prior Anticoagulation Instructions: INR 2.5  Continue Coumadin as scheduled:  1 tablet every day fo the week, except 1/2 tablet on Monday and Friday.  Return to clinic on 03/26/2010.    Current Anticoagulation Instructions: INR:  2.9 (goal 2-3)  Your INR is at goal today.  Please continue Coumadin 1 tablet every day except 1/2 a tablet on Monday and Friday.  Return in 4 weeks for another INR check.

## 2010-06-12 DIAGNOSIS — I82409 Acute embolism and thrombosis of unspecified deep veins of unspecified lower extremity: Secondary | ICD-10-CM

## 2010-06-12 DIAGNOSIS — Z7901 Long term (current) use of anticoagulants: Secondary | ICD-10-CM

## 2010-06-22 ENCOUNTER — Encounter (INDEPENDENT_AMBULATORY_CARE_PROVIDER_SITE_OTHER): Payer: BC Managed Care – PPO

## 2010-06-22 ENCOUNTER — Encounter: Payer: Self-pay | Admitting: Cardiology

## 2010-06-22 DIAGNOSIS — I80299 Phlebitis and thrombophlebitis of other deep vessels of unspecified lower extremity: Secondary | ICD-10-CM

## 2010-06-22 DIAGNOSIS — Z7901 Long term (current) use of anticoagulants: Secondary | ICD-10-CM

## 2010-06-22 LAB — CONVERTED CEMR LAB: POC INR: 3.3

## 2010-07-17 NOTE — Medication Information (Signed)
Summary: rov/ewj   Anticoagulant Therapy  Managed by: Weston Brass, PharmD Referring MD: wert MD, Casimiro Needle PCP: Nolon Lennert MD: Jens Som MD, Arlys John Indication 1: DVT Lab Used: [facility] [place] Site: [address] INR POC 3.3 INR RANGE 2-3  Dietary changes: no    Health status changes: no    Bleeding/hemorrhagic complications: no    Recent/future hospitalizations: no    Any changes in medication regimen? no    Recent/future dental: no  Any missed doses?: no       Is patient compliant with meds? yes       Allergies: No Known Drug Allergies  Anticoagulation Management History:      The patient is taking warfarin and comes in today for a routine follow up visit.  Negative risk factors for bleeding include an age less than 39 years old.  The bleeding index is 'low risk'.  Positive CHADS2 values include History of HTN.  Negative CHADS2 values include Age > 26 years old.  His last INR was 2.12.  Anticoagulation responsible provider: Jens Som MD, Arlys John.  INR POC: 3.3.  Cuvette Lot#: [id number].  Exp: 04/2011.    Anticoagulation Management Assessment/Plan:      The patient's current anticoagulation dose is Coumadin 5 mg tabs: Take as directed by coumadin clinic..  The target INR is 2.0-3.0.  The next INR is due 07/13/2010.  Anticoagulation instructions were given to patient.  Results were reviewed/authorized by Weston Brass, PharmD.  He was notified by Weston Brass PharmD.         Prior Anticoagulation Instructions: INR:  2.9 (goal 2-3)  Your INR is at goal today.  Please continue Coumadin 1 tablet every day except 1/2 a tablet on Monday and Friday.  Return in 4 weeks for another INR check.   Current Anticoagulation Instructions: INR 3.3  Take 1/2 tablet on Saturday then resume same dose of 1 tablet every day except 1/2 tablet on Monday and Friday.  Recheck INR in 3 weeks.

## 2010-08-08 LAB — COMPREHENSIVE METABOLIC PANEL
ALT: 53 U/L (ref 0–53)
Alkaline Phosphatase: 59 U/L (ref 39–117)
BUN: 12 mg/dL (ref 6–23)
Chloride: 106 mEq/L (ref 96–112)
Glucose, Bld: 110 mg/dL — ABNORMAL HIGH (ref 70–99)
Potassium: 3.7 mEq/L (ref 3.5–5.1)
Sodium: 139 mEq/L (ref 135–145)
Total Bilirubin: 0.9 mg/dL (ref 0.3–1.2)
Total Protein: 6.6 g/dL (ref 6.0–8.3)

## 2010-08-08 LAB — PROTIME-INR
INR: 0.9 (ref 0.00–1.49)
INR: 1 (ref 0.00–1.49)
Prothrombin Time: 12.7 seconds (ref 11.6–15.2)

## 2010-08-08 LAB — CBC
HCT: 45.5 % (ref 39.0–52.0)
HCT: 49.9 % (ref 39.0–52.0)
Hemoglobin: 15.5 g/dL (ref 13.0–17.0)
Hemoglobin: 17.3 g/dL — ABNORMAL HIGH (ref 13.0–17.0)
MCHC: 34.1 g/dL (ref 30.0–36.0)
Platelets: 164 10*3/uL (ref 150–400)
RBC: 5.28 MIL/uL (ref 4.22–5.81)
RDW: 13.3 % (ref 11.5–15.5)
RDW: 13.6 % (ref 11.5–15.5)
WBC: 4.3 10*3/uL (ref 4.0–10.5)

## 2010-08-31 ENCOUNTER — Telehealth: Payer: Self-pay | Admitting: Pharmacist

## 2010-08-31 NOTE — Telephone Encounter (Signed)
Pt is coming in on Monday at 10:15 to see TP.

## 2010-08-31 NOTE — Telephone Encounter (Signed)
Needs appt with me or Tammy Np asap

## 2010-08-31 NOTE — Telephone Encounter (Signed)
Called patient to schedule appt to check INR.  Pt was last seen in February 2012.  Pt states he does not want to make an appt at this time because he is going to contact his MD to schedule a doppler to hopefully get off Coumadin.  Explained to pt we would still need to monitor his Coumadin at least every month.  He still refused to make appt.  Will let Dr. Sherene Sires know.

## 2010-09-03 ENCOUNTER — Encounter: Payer: Self-pay | Admitting: Adult Health

## 2010-09-03 ENCOUNTER — Other Ambulatory Visit (INDEPENDENT_AMBULATORY_CARE_PROVIDER_SITE_OTHER): Payer: BC Managed Care – PPO

## 2010-09-03 ENCOUNTER — Ambulatory Visit (INDEPENDENT_AMBULATORY_CARE_PROVIDER_SITE_OTHER): Payer: BC Managed Care – PPO | Admitting: Adult Health

## 2010-09-03 DIAGNOSIS — J45909 Unspecified asthma, uncomplicated: Secondary | ICD-10-CM

## 2010-09-03 DIAGNOSIS — I82409 Acute embolism and thrombosis of unspecified deep veins of unspecified lower extremity: Secondary | ICD-10-CM

## 2010-09-03 LAB — CBC WITH DIFFERENTIAL/PLATELET
Basophils Absolute: 0 10*3/uL (ref 0.0–0.1)
Basophils Relative: 0.3 % (ref 0.0–3.0)
Eosinophils Absolute: 0.2 10*3/uL (ref 0.0–0.7)
Hemoglobin: 15.6 g/dL (ref 13.0–17.0)
Lymphs Abs: 2.3 10*3/uL (ref 0.7–4.0)
MCHC: 35.3 g/dL (ref 30.0–36.0)
MCV: 90.6 fl (ref 78.0–100.0)
Monocytes Absolute: 0.5 10*3/uL (ref 0.1–1.0)
Neutro Abs: 4.6 10*3/uL (ref 1.4–7.7)
RBC: 4.87 Mil/uL (ref 4.22–5.81)
RDW: 13.9 % (ref 11.5–14.6)

## 2010-09-03 LAB — BASIC METABOLIC PANEL
BUN: 18 mg/dL (ref 6–23)
Calcium: 9.3 mg/dL (ref 8.4–10.5)
Creatinine, Ser: 1.2 mg/dL (ref 0.4–1.5)
GFR: 72.4 mL/min (ref >60.00)

## 2010-09-03 LAB — PROTIME-INR
INR: 1.5 ratio — ABNORMAL HIGH (ref 0.8–1.0)
Prothrombin Time: 16 s — ABNORMAL HIGH (ref 10.2–12.4)

## 2010-09-03 LAB — TSH: TSH: 0.98 u[IU]/mL (ref 0.35–5.50)

## 2010-09-03 MED ORDER — BUDESONIDE-FORMOTEROL FUMARATE 80-4.5 MCG/ACT IN AERO: 2.0000 | INHALATION_SPRAY | Freq: Two times a day (BID) | RESPIRATORY_TRACT | Status: DC

## 2010-09-03 NOTE — Progress Notes (Signed)
Subjective:    Patient ID: Jose Crane, male    DOB: Sep 14, 1965, 45 y.o.   MRN: [id number]  HPI 45 yo WM   yowm never smoker with a history of chidlhod asthma & status asthmaticus requiring mechanical ventilation 4/97 at [facility].  He typically uses Symbicort, more on an as-needed basis rather than every day. He has only needed albuterol a few times in the past year.   March 21, 2009 c/o "still feels clot in leg = leg gets warm but not painful, never swells followed in coumadin clinic and wants to consider stopping after 6 months of rx. 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   May 15, 2009--Returns for 2 week follow up for HTN after rx with diovan.   June 26, 2009 ov 6 wk followup. Pt states that BP has been better with diovan. HA less, veritigo when rolls over in bed, no tinnitus, no ear aches, no nausea.   October 10, 2009--Presents for an acute office visit. Pt c/o left ankle swelling x 2 weeks after being hit with a weight. Pt has a hx of DVT in left leg s/p achilles tendon repair last year. follow up Doppler in 12/10 showed chronic clot. He is currently on coumadin. He was outside with dog 2 weeks ago and dog was tied to weight , dog took off and 25lb weight rolled into pt left lower leg and ankle. It is now swollen from foot to knee. Mild eccymosis noted. Last INR was 1.8. He denies chest pain or dyspnea. Lower leg tender. No significant pain w/ weight bearing. Denies chest pain, dyspnea, orthopnea, hemoptysis, fever, n/v/[name],   October 24, 2009-Presents for work in visit. Complains that he "pulled his hamstring on L leg" this past Saturday while trying to catch a ball playing baseball. Pt states he is taking acetaminophen and also using heat and alternating with ice on his leg but still c/o pain. Pt states he has "2 knots" on his leg where he pulled his muscle. Pt states he is able to extend and flex his leg but states it's "excrutiating". Ran to catch foul ball felt pop in back of leg.  Painful to bear weight Has swelling in thigh. Was seen 2 weeks ago for lower leg injury w/ weight. Underwent venous doppler which showed improved clot resolution in thigh but chronic clot remained in popliteal/peroneal. His lower leg edema has much improved and is back to baseline.Marland Kitchen He remains on coumadin. Last INR 1.8. Has follow up later this week. rec orthopedic eval.   November 07, 2009 ov 4 wk followup. Pt still c/o leg left foot edema. He also c/o pain in left leg from where he pulled hamstring x 2 wks ago. leg swelling was better when elevated, worse once back down. no cp, sob.>>coumadin continued    09/03/10 Follow up   Pt returns for follow up . He remains on coumadin. He is very frustrated that he has to go to coumadin clinic monthly and pay  $80. He has not been since 05/2010. Last doppler 09/2009 showed remaining DVT in lower leg. We discussed several options with him  Today. Also spoke with Jose Crane at [facility] regarding   Says he is doing fine. NO bloody stools, urine or sputum.   He is very active  ,plays ball . Does get bruising often.   Asthma under good control. Rare use of ventolin.  Takes symbicort most day , 1 puff Twice daily  Review of Systems Constitutional:   No  weight loss, night sweats,  Fevers, chills   HEENT:   No headaches,  Difficulty swallowing,  Tooth/dental problems, or  Sore throat,                No sneezing, itching, ear ache, nasal congestion, post nasal drip,   CV:  No chest pain,  Orthopnea, PND, swelling in lower extremities, anasarca, dizziness, palpitations, syncope.   GI  No heartburn, indigestion, abdominal pain, nausea, vomiting, diarrhea, change in bowel habits, loss of appetite, bloody stools.   Resp: No shortness of breath with exertion or at rest.  No excess mucus, no productive cough,  No non-productive cough,  No coughing up of blood.  No change in color of mucus.  No wheezing.  No chest wall deformity  Skin: no rash or lesions.  GU:  no dysuria, change in color of urine, no urgency or frequency.  No flank pain, no hematuria   MS:  No joint pain or swelling.  No decreased range of motion.  No back pain.  Psych:  No change in mood or affect. No depression or anxiety.  No memory loss.         Objective:   Physical Exam GEN: A/Ox3; pleasant , NAD, well nourished   HEENT:  [place]/AT,  EACs-clear, TMs-wnl, NOSE-clear, THROAT-clear, no lesions, no postnasal drip or exudate noted.   NECK:  Supple w/ fair ROM; no JVD; normal carotid impulses w/o bruits; no thyromegaly or nodules palpated; no lymphadenopathy.  RESP  Clear  P & A; w/o, wheezes/ rales/ or rhonchi.no accessory muscle use, no dullness to percussion  CARD:  RRR, no m/r/g  , no peripheral edema, pulses intact, no cyanosis or clubbing. Mild venous insufficiency w/ ropey veins  GI:   Soft & nt; nml bowel sounds; no organomegaly or masses detected.  Musco: Warm bil, no deformities or joint swelling noted.   Neuro: alert, no focal deficits noted.    Skin: Warm, no lesions or rashes          Assessment & Plan:

## 2010-09-03 NOTE — Assessment & Plan Note (Addendum)
Recheck venous doppler  IF positive will need referral to vascular to help guide need for coumadin rx.  IF + and needs coumadin will need ov with Orma Flaming D to discuss options  For coumadin montiroing or alternative agents of blood thinners.

## 2010-09-03 NOTE — Assessment & Plan Note (Addendum)
Well controlled  Decrease symbicort dose to 80.

## 2010-09-03 NOTE — Patient Instructions (Addendum)
Decrease Symbicort 80/4.55mcg 2 puffs Twice daily   I will call with labs  We are rechecking venous dopplers  follow up Dr. Sherene Sires  In 3 months for physical.

## 2010-09-05 ENCOUNTER — Encounter (INDEPENDENT_AMBULATORY_CARE_PROVIDER_SITE_OTHER): Payer: BC Managed Care – PPO | Admitting: *Deleted

## 2010-09-05 DIAGNOSIS — I82409 Acute embolism and thrombosis of unspecified deep veins of unspecified lower extremity: Secondary | ICD-10-CM

## 2010-09-06 ENCOUNTER — Encounter: Payer: Self-pay | Admitting: Internal Medicine

## 2010-09-09 ENCOUNTER — Telehealth: Payer: Self-pay | Admitting: Adult Health

## 2010-09-10 ENCOUNTER — Encounter: Payer: Self-pay | Admitting: Adult Health

## 2010-09-10 ENCOUNTER — Other Ambulatory Visit: Payer: Self-pay | Admitting: Adult Health

## 2010-09-10 DIAGNOSIS — I82409 Acute embolism and thrombosis of unspecified deep veins of unspecified lower extremity: Secondary | ICD-10-CM

## 2010-09-10 NOTE — Assessment & Plan Note (Signed)
Refer to vascular to evaluate for chronic left dvt

## 2010-09-10 NOTE — Telephone Encounter (Signed)
Error opening  

## 2010-09-11 NOTE — Assessment & Plan Note (Signed)
[facility]                             PULMONARY OFFICE NOTE   NAME:Crane, Jose                     MRN:          [id number]  DATE:01/30/2007                            DOB:          February 18, 1966    HISTORY:  This is a 45 year old white male with a history of non-  adherence and a tendency of difficult to control asthma once resulting  in status asthmatic requiring mechanical ventilation. He comes in today  as an acute work in with a sore throat for the last three days  associated with mild dysphagia, headache, and sweats. He has had no  rigors, purulent sputum, or nasal complaints.   It turns out he stopped his Qvar a few months back and has been using  increased albuterol over baseline and getting by fine. He is  concerned, however, because he has found out that his albuterol CFC is  going off the market.   Presently, he denies any dyspnea. His last use of albuterol was over 6  hours ago.   PHYSICAL EXAMINATION:  GENERAL:  He is a robust ambulatory healthy  appearing white male in no acute distress.  VITAL SIGNS:  Afebrile and stable except for a blood pressure of 130/98.  HEENT:  Reveals moderate erythema of the oropharynx with no evidence of  significant exudates or purulent post-nasal drainage.  NECK:  There was minimal cervical adenopathy and no tenderness. Trachea  is midline. No thyromegaly.  LUNGS:  Fields reveal trace wheeze bilaterally.  CARDIAC:  Regular rate and rhythm with no murmurs, rubs, or gallops.  ABDOMEN:  Soft and benign.  EXTREMITIES:  Warm without calf tenderness, cyanosis, clubbing, or  edema.   IMPRESSION:  1. Acute URI with prominent pharyngitis, either a TWAR-type atypical      infection or viral pharyngitis. I recommended doxycycline to cover      the possibility of a TWAR infection, but told him that it was      probably a viral infection that we just need to let run its course      and recommended  non-menthol containing lozenges and salt water      gargles for this.  2. My greater concern with this patient is that he continues to ignore      the fact that he has got chronic asthma and tends to cover up his      symptoms with albuterol CFC, which is no longer going to be      available. He seems very hard-headed, so I chose this opportunity      to move the mountain to [facility]  by introducing the use of      Symbicort 160/4.5 2 puffs b.i.d. assuring him that this would be a      better p.r.n. than anything else available to him because it would      avoid the inappropriate use of medicines that just focused on      symptoms (such as Proventil in this case) without giving him the      inhaled steroid, which he desperately needs. My recommendation was  to stay on it 2 puffs twice daily, but acknowledge up front to him      that I understand that he is going to taper it, and if he does      taper it off, the worst thing that can happen is that he will find      that he needs it to increase it back up to the previous dose that      worked, but in the meantime, will get the immediate benefit of the      full beta-agonist in Symbicort. I also acknowledged that this is      done in [place], but is not approved for this purpose in the [place]. I actually discussed this with the patient.  3. The patient also reports that he is losing his job and he may not      able to continue to pay for his medications. Unfortunately, we do      not have this in generic, but we would be happy to provide him      samples in the context of regular pulmonary follow up, at least      every 3 months.     Charlaine Dalton. Sherene Sires, MD, [facility]  Electronically Signed    MBW/MedQ  DD: 01/30/2007  DT: 01/31/2007  Job #: [id number]

## 2010-09-11 NOTE — Letter (Signed)
November 28, 2008     RE:  Jose, Crane  MRN:  [id number]  /  DOB:  1965-07-11   To Whom It May Concern:   This patient has been under the care of [facility] since the onset of acute left leg pain  approximately November 07, 2008, to present.   He developed an extensive and potentially life-threatening clot  involving the entire venous system of his left leg and has been on  intensive injections of Lovenox as well as oral doses of Coumadin that  have been monitored closely by the [facility] since he was  initially seen in the clinic on November 14, 2008.   Because of his condition, he was warned to stay off his feet as much as  possible and elevate the leg to reduce swelling.   I would anticipate that he should be able to return to full-time duty by  the 1st of September if all goes well, sooner if his condition improves  in the meantime.    Sincerely,      Casimiro Needle B. Sherene Sires, MD, [facility]  Electronically Signed    MBW/MedQ  DD: 11/28/2008  DT: 11/29/2008  Job #: [id number]

## 2010-09-11 NOTE — Op Note (Signed)
NAME:  Jose Crane, Jose Crane            ACCOUNT NO.:  [id number]   MEDICAL RECORD NO.:  [id number]          PATIENT TYPE:  AMB   LOCATION:  DAY                          FACILITY:  [facility]   PHYSICIAN:  Ronald A. Gioffre, M.D.DATE OF BIRTH:  06/15/1965   DATE OF PROCEDURE:  08/17/2008  DATE OF DISCHARGE:                               OPERATIVE REPORT   SURGEON:  Georges Lynch. Darrelyn Hillock, M.D.   ASSISTANT:  Nurse.   PREOPERATIVE DIAGNOSIS:  Complete rupture of the left Achilles tendon.   PREOPERATIVE DIAGNOSIS:  Complete rupture of the left Achilles tendon.   OPERATION:  1. Open primary repair of a complete rupture of the left Achilles      tendon.  2. Tissue mend tubular graft, 5 x 5 measurement, around the tendon.  3. Application of a short-leg cast with his foot in plantar flexion.   PROCEDURE:  Under general anesthesia, the patient in a prone position, a  routine orthopedic prep and draping of the left lower extremity was  carried out.  He had 1 gram of IV Ancef.  At this time, a curvilinear  incision was made over the Achilles tendon.  Great care was taken not to  injure the underlying neurovascular structures.  No tourniquet was used.  Self-retaining retractors were inserted, bleeders identified and  cauterized.  At this time, I identified a complete rupture of the  Achilles tendon.  The foot was placed in plantar flexion.  That helped  bring the ends of the tendon together.  I then, by primary suture,  sutured the tendon together with a #1-Ethibond suture.  Following that,  I then utilized a 5- x 5-cm tissue mend graft.  I brought the graft  circumferentially around the tendon, sewed the graft ends to itself and  also to the tendon.  Note, my associate did the same procedure on him 7  years ago and he had that grafted as well and did well.  Following that,  I put a sterile dressing on after we closed the wound with 3-0 nylon  suture.  Sterile dressing was applied.  I then placed his  foot in  plantar flexion and then casted him in a short-leg cast which was well-  padded.  Note, there was no tension on the tendon with the foot plantar  flexed and then flexed and extending his knee.  He will be placed on a  Coumadin/heparin protocol.           ______________________________  Georges Lynch. Darrelyn Hillock, M.D.     RAG/MEDQ  D:  08/17/2008  T:  08/17/2008  Job:  [id number]

## 2010-09-11 NOTE — Discharge Summary (Signed)
[name], Jose Crane            ACCOUNT NO.:  [id number]   MEDICAL RECORD NO.:  [id number]          PATIENT TYPE:  OIB   LOCATION:  1536                         FACILITY:  [facility]   PHYSICIAN:  Jose Crane, M.D.DATE OF BIRTH:  17-Jul-1965   DATE OF ADMISSION:  08/17/2008  DATE OF DISCHARGE:                               DISCHARGE SUMMARY   HOSPITAL COURSE:  He was taken to surgery on August 17, 2008.  I repaired  a complete disruption of his Achilles tendon.  I did a primary repair  and then utilized a 5 x 5 cm TissueMend  circumferential graft around  the tendon.  He was then placed in plantar flexion in a short-leg cast.  Postop, he was managed on heparin and Coumadin protocol.  He was then  seen by me this morning on August 18, 2008.  Circulation is intact.  He  is doing fine.  He is afebrile.  He is going to be discharged on  crutches to be followed by me in the office.  He is being discharged on  Coumadin 5 mg a day and his Coumadin INR level will be managed at Dr.  Avie Echevaria office at the [facility].   LABORATORY DATA:  Sodium 139, potassium 3.7, chloride 106, carbon  dioxide 23, glucose 110, BUN 12, creatinine 1.29.  His liver function  studies shows that his SGOT was slightly elevated, it was 39, the upper  limits is 37.  Remaining studies were normal.  The initial prothrombin  time is 12.7, INR was 910, white count was 4300, hemoglobin 73,  hematocrit 49.9.   DIAGNOSTICS:  His EKG was normal.   FINAL DISCHARGE DIAGNOSIS:  Complete disruption of his Achilles tendon  on the left.   CONDITION ON DISCHARGE:  Improved.   DISCHARGE DIET:  Regular diet.   DISCHARGE MEDICATIONS:  1. He will be maintained on Percocet 10/50 one every 4 hours p.r.n.      for pain.  2. Coumadin 5 mg a day and that will be monitored at the [facility].  3. He was also on IV Ancef while in the hospital.   DISCHARGE INSTRUCTIONS:  1. See me in the office in 2 weeks or  prior to that if there is a      problem.  2. He will ambulate with crutches, non-weightbearing.           ______________________________  Jose Crane, M.D.     RAG/MEDQ  D:  08/18/2008  T:  08/18/2008  Job:  [id number]   cc:   Windy Fast A. Darrelyn Crane, M.D.  Fax: [phone]

## 2010-09-11 NOTE — Consult Note (Signed)
VASCULAR SURGERY CONSULTATION   Jose Crane, Jose Crane  DOB:  04-17-1966                                       11/17/2008  ZOXWR#:[id number]   REQUESTING PHYSICIAN:  Georges Lynch. Darrelyn Hillock, M.D.   PRIMARY CARE PHYSICIAN:  Casimiro Needle B. Sherene Sires, MD, FCCP.   REFERRAL DIAGNOSIS:  Extensive deep venous thrombosis left lower  extremity.   HISTORY:  The patient is a 45 year old gentleman who suffered a left  Achilles tendon tear April 20 of this year.  Underwent operative repair.  Was placed in a cast for approximately 6-1/2 weeks.  He was on Coumadin  during this time.  Following removal of his cast he noted left calf  pain.  His pain recently migrated up into his left thigh.  He underwent  a lower extremity venous Doppler evaluation at [facility] on 11/14/2008  revealing acute venous thrombosis involving the left femoral, popliteal,  posterior tibial and peroneal veins.  Partial thrombosis of the  saphenofemoral junction noted.   The patient complains primarily of pain.  He has had minimal to limited  swelling.  The significant pain is in his left thigh.  He has no history  of deep venous thrombosis.  No family history of DVT.   PAST MEDICAL HISTORY:  1. Asthma.  2. Right Achilles tendon injury 10 years ago.   SOCIAL HISTORY:  The patient is single, he has no children.  He does not  smoke.  He has 4-5 alcoholic beverages per month.  He has consumed  alcohol more heavily in the past.   FAMILY HISTORY:  Noncontributory, the patient is adopted.   REVIEW OF SYSTEMS:  He notes a history of asthma and wheezing.  He does  have pain in the right thigh.   MEDICATIONS:  1. Symbicort 2 puffs daily.  2. Albuterol p.Crane.n.  3. Lovenox one injection b.i.d.  4. Coumadin as directed.  5. Hydrocodone p.Crane.n.   ALLERGIES:  None known.   PHYSICAL EXAM:  General:  A well-appearing 45 year old male.  Appears  his stated age.  No acute distress.  Alert and oriented.  Vital signs:  BP  126/85, pulse is 83 per minute, respirations 16 per minute.  Abdomen:  Soft, nontender.  No masses or organomegaly.  Lower extremities:  2+  femoral, popliteal, posterior tibial and dorsalis pedis pulses  bilaterally.  1+ edema of the left calf and ankle.  No edema of the  right lower extremity.  Full range of motion.  Mild erythema noted in  the left thigh.   IMPRESSION:  1. Left lower extremity acute deep venous thrombosis involving the      femoral, popliteal and tibial veins with minimal to limited      swelling.  2. Recent Achilles tendon tear with operative repair.  3. Asthma.   RECOMMENDATIONS:  The patient has limited sequelae at this time of deep  venous thrombosis with mild swelling in his left leg.  Venous Doppler  reveals involvement of the femoral, popliteal and tibial veins.  There  is no evidence of iliac involvement.  I do not feel that a filter will  be required at this time.  He is well anticoagulated with Lovenox and  Coumadin.  Suggest continuing Coumadin for a minimum of 6 months with  followup Doppler and reassess clot load at that time for decision  regarding  continued therapy.   Balinda Quails, M.D.  Electronically Signed  PGH/MEDQ  D:  11/17/2008  T:  11/18/2008  Job:  [id number]

## 2010-09-14 NOTE — Op Note (Signed)
[facility]  Patient:    Jose Crane, Jose Crane Visit Number: [id number] MRN: [id number]          Service Type: OBV Location: 4W [id number] 01 Attending Physician:  Rocky Crafts Dictated by:   Michael Litter. Montez Morita, M.D. Proc. Date: 11/18/01 Admit Date:  11/18/2001 Discharge Date: 11/19/2001                             Operative Report  SURGEON:  Philips J. Montez Morita, M.D.  ASSISTANT:  Ottie Glazier. Wynona Neat, P.A.-C.  PREOPERATIVE DIAGNOSIS:  Torn Achilles tendon, right leg.  POSTOPERATIVE DIAGNOSIS:  Torn Achilles tendon, right leg.  OPERATION PERFORMED:  Achilles tendon repair with reinforcement with a graft jacket.  DESCRIPTION OF PROCEDURE:  After suitable general anesthesia, he is positioned prone with an upper thigh tourniquet applied and with the leg elevated it is inflated to 370 mmHg after which the foot, ankle and leg are prepped with Duraprep and draped routinely. A transverse incision is made over the site of the tear connecting with lateral and medial limbs inferior and superior respectively. The subcutaneous tissue was cleared and a similar incision was made in the tendon sheath. Hematoma was debrided from about the area and a #2 Ethibond with needles attached on both ends was then done in a Bunnell fashion from the distal part of the distal tendon and brought out through its ends from the proximal tendon and brought out through its ends and as the assistant held one knot on one side, I tied the suture on the other and tied the second so that things were nicely approximated. As reinforcement, a graft jacket was wrapped around and sutured with 3-0 PDS absorbable suture to reinforce the repair. This same suture was used to repair the tendon sheath particularly right over the site of the repair and then some in the subcu and then some interrupted nylon in the skin. Prior to closure, the tourniquet was let down and good hemostasis was noted. At the  end of the procedure, about 20 cc of 0.5% plain Marcaine were injected about the area. A dressing was applied with tape after which a short leg fiberglass cast was applied with him in the prone position. He was then turned onto his back on the stretcher and the cast completed above the knee with the knee in about 30 degrees of flexion to take stress off the gastroc. He went on to recovery in good condition. Dictated by:   [employer]. Montez Morita, M.D. Attending Physician:  Rocky Crafts DD:  11/18/01 TD:  11/23/01 Job: [id number]

## 2010-09-14 NOTE — Assessment & Plan Note (Signed)
[facility]                               PULMONARY OFFICE NOTE   NAME:Jose Crane, Jose Crane                     MRN:          [id number]  DATE:11/21/2005                            DOB:          15-Mar-1966    HISTORY:  This is a 45 year old white male with severe asthma and documented  non-adherence, who is maintained presently on Qvar 40, two puffs b.i.d.,  with minimum need for albuterol since his last visit.  He also denies any  decrease in activity tolerance (though he is not consistently overtly  active).  Denies fevers, chills, sweats, chest pain, cough, sinus or reflux  symptoms.   PHYSICAL EXAMINATION:  GENERAL:  He is a pleasant ambulatory white man in no  acute distress.  VITAL SIGNS:  Weight 202 pounds.  No change in his baseline.  HEENT:  Unremarkable.  NECK:  Clear.  LUNGS:  Lung fields are perfectly bilaterally to percussion.  HEART:  There is a regular rhythm without murmur, gallop or rub.  ABDOMEN:  Soft, benign.  EXTREMITIES:  No clubbing, cyanosis or edema   IMPRESSION:  1.  His asthma is well-controlled on his present regimen, which I reviewed      with him in detail, including the rule of 2's and the need to call me      if he notices any increased need for albuterol.  2.  Hyperlipidemia was also discussed in the context of recent comprehensive      health care evaluation.  He has an LDL of 154, up from a previous level      of 146, with unknown family history, because he is adopted, and I      recommended a target of 130.  I offered to consider statin therapy, but      knowing how nonadherent this patient is, but this patient wanted to try      diet and exercise first and I agreed with this, as long as he remembers      to return for a fasting lipid profile in three months.                                   Charlaine Dalton. Sherene Sires, MD, [facility]   MBW/MedQ  DD:  11/22/2005  DT:  11/22/2005  Job #:  [id number]

## 2010-09-25 ENCOUNTER — Encounter (INDEPENDENT_AMBULATORY_CARE_PROVIDER_SITE_OTHER): Payer: BC Managed Care – PPO | Admitting: Vascular Surgery

## 2010-09-25 ENCOUNTER — Encounter (INDEPENDENT_AMBULATORY_CARE_PROVIDER_SITE_OTHER): Payer: BC Managed Care – PPO

## 2010-09-25 DIAGNOSIS — I871 Compression of vein: Secondary | ICD-10-CM

## 2010-09-25 DIAGNOSIS — I8010 Phlebitis and thrombophlebitis of unspecified femoral vein: Secondary | ICD-10-CM

## 2010-09-26 NOTE — Assessment & Plan Note (Signed)
OFFICE VISIT  ELWYN, KLOSINSKI R DOB:  [birth year]                                       09/25/2010 EAVWU#:[id number]  Mr.  Ziegler is a 45 year old healthy male who had a torn Achilles tendon in April 2010.  He was in a cast for 6-1/2 weeks on Coumadin. After removal of the cast, he developed pain in the thigh and calf area and was found have a DVT which is fairly extensive involving the femoral, popliteal, posterior tibial and peroneal veins.  He has been on Coumadin for the past 2 years and has had multiple ultrasounds most recently in May of this year which revealed some persistent organized thrombus and partial obstruction at the popliteal level but no acute DVT known.  The new thrombus in the superficial femoral vein has lysed in the past.  The decision is whether to continue Coumadin.  He denies any symptoms of swelling or pain in the left calf and thigh.  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  He has some aching and throbbing discomfort in these areas as the day progresses but no distal edema and no history of DVT in the right leg.  Chronic medical problems: 1. Asthma. 2. History of bilateral rupture of the Achilles tendons, the right     side 12 years ago.  SOCIAL HISTORY:  The patient does not smoke.  He has 4-5 alcoholic beverages is per month.  FAMILY HISTORY:  Noncontributory.  The patient is adopted.  REVIEW OF SYSTEMS:  He denies any chest pain.  Does have a history of asthma, wheezing.  All other systems are negative on complete review of systems.  PHYSICAL EXAMINATION:  Blood pressure is 139/84, heart rate is 97, respirations 20. GENERAL:  He is a well-developed, well-nourished male in no apparent distress, alert and oriented x3. HEENT:  Exam normal for age.  EOMs intact. LUNGS:  Clear to auscultation.  No rhonchi or wheezing. CARDIOVASCULAR:  Regular rhythm.  No  murmurs. ABDOMEN:  Soft, nontender with no masses. MUSCULOSKELETAL:  No major deformities. NEUROLOGIC:  Normal. Lower extremity exam reveals the left leg has 3+ femoral, popliteal, and dorsalis pedis pulse as does the right leg.  There is about a 1/2 cm of asymmetry, left calf and thigh larger than the right which is not obvious.  Right leg has varicosities along the great saphenous system between the knee and ankle.  There is very mild hyperpigmentation.  Today I ordered a venous duplex reflux study of the right leg which revealed no gross reflux in the great or small saphenous vein.  There is reflux in this string of bulging varicosities over the great saphenous system with a perforator distally possibly supplying this.  I do not think it would be unreasonable for him to discontinue Coumadin at this point after 2 years to see if he tolerates this well and continue with  aspirin daily.  If he develops any further DVTs in the future, he should remain on Coumadin definitely.  His left leg is asymptomatic.  The right leg does not need laser ablation but could be treated with ambulatory phlebectomy between the knee and ankle for these symptomatic varicosities.  If he decides he would like to proceed with treatment of this, he will be in touch with [place].  We  will treat him with 3 months of conservative treatment with elastic compression stockings, elevation and ibuprofen.  The patient will get back in touch with Dr. Sherene Sires to decide whether he will discontinue the Coumadin or not.    Quita Skye Hart Rochester, M.D. Electronically Signed  JDL/MEDQ  D:  09/25/2010  T:  09/26/2010  Job:  [id number]

## 2010-10-01 NOTE — Procedures (Unsigned)
LOWER EXTREMITY VENOUS REFLUX EXAM  INDICATION:  Right lower extremity varicose veins.  EXAM:  Using color-flow imaging and pulse Doppler spectral analysis, the right common femoral, superficial femoral, popliteal, posterior tibial, greater and lesser saphenous veins are evaluated.  There is evidence suggesting deep venous insufficiency in the right lower extremity popliteal vein.  The right saphenofemoral junction is competent. The right GSV is competent.  The right proximal short saphenous vein demonstrates competency.  GSV Diameter (used if found to be incompetent only)                                           Right    Left Proximal Greater Saphenous Vein           cm       cm Proximal-to-mid-thigh                     cm       cm Mid thigh                                 cm       cm Mid-distal thigh                          cm       cm Distal thigh                              cm       cm Knee                                      cm       cm  IMPRESSION: 1. Right greater saphenous vein is competent. 2. The deep venous system is not competent with Reflux of     >536milliseconds in the right popliteal vein. 3. The right lesser saphenous vein is competent.  ___________________________________________ Quita Skye. Hart Rochester, M.D.  AS/MEDQ  D:  09/25/2010  T:  09/25/2010  Job:  [id number]

## 2010-10-31 ENCOUNTER — Other Ambulatory Visit: Payer: Self-pay | Admitting: Internal Medicine

## 2010-11-01 ENCOUNTER — Other Ambulatory Visit: Payer: Self-pay | Admitting: Internal Medicine

## 2011-02-19 ENCOUNTER — Encounter: Payer: Self-pay | Admitting: Adult Health

## 2011-02-20 ENCOUNTER — Ambulatory Visit: Payer: Self-pay

## 2011-02-20 DIAGNOSIS — Z7901 Long term (current) use of anticoagulants: Secondary | ICD-10-CM

## 2011-02-20 DIAGNOSIS — I82409 Acute embolism and thrombosis of unspecified deep veins of unspecified lower extremity: Secondary | ICD-10-CM

## 2011-04-01 ENCOUNTER — Other Ambulatory Visit: Payer: Self-pay | Admitting: Internal Medicine

## 2011-04-18 ENCOUNTER — Telehealth: Payer: Self-pay | Admitting: Internal Medicine

## 2011-04-18 NOTE — Telephone Encounter (Signed)
I spoke with Jose Crane and he states he has developed what he thinks is a rash. He states he has spots on his face, arms, hands, back, chest x Tuesday. The spots on his arm are a little swollen. He states it does not itch and is not painful. Jose Crane doesn't think it's poison ivy. Jose Crane has not tried anything OTC. Since MW is not in will forward to TP. Please advise Tammy, thanks

## 2011-04-18 NOTE — Telephone Encounter (Signed)
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  Please contact office for sooner follow up if symptoms do not improve or worsen or seek emergency care

## 2011-04-18 NOTE — Telephone Encounter (Signed)
I spoke with pt and is aware of TP recs. He is scheduled to come in and see TP tomorrow at 11:00

## 2011-04-19 ENCOUNTER — Encounter: Payer: Self-pay | Admitting: Adult Health

## 2011-04-19 ENCOUNTER — Ambulatory Visit (INDEPENDENT_AMBULATORY_CARE_PROVIDER_SITE_OTHER): Payer: BC Managed Care – PPO | Admitting: Adult Health

## 2011-04-19 VITALS — BP 130/72 | HR 76 | Temp 97.7°F | Ht 72.0 in | Wt 220.2 lb

## 2011-04-19 DIAGNOSIS — L259 Unspecified contact dermatitis, unspecified cause: Secondary | ICD-10-CM

## 2011-04-19 MED ORDER — PREDNISONE 10 MG PO TABS: ORAL_TABLET | ORAL | Status: DC

## 2011-04-19 NOTE — Patient Instructions (Addendum)
Zyrtec 10mg  At bedtime  For 1 week  Pepcid 20mg  At bedtime  For 1 week.  Prednisone taper over next week  Avoid hot showers Cool compresses As needed   OTC Hydrocortisone cream to rash x 3 days on arms  Please contact office for sooner follow up if symptoms do not improve or worsen or seek emergency care  Call back if not improving will refer to dermatology  follow up Dr. Sherene Sires in 1 month for physical.

## 2011-04-19 NOTE — Progress Notes (Signed)
Subjective:    Patient ID: Jose Crane, male    DOB: Jun 01, 1965, 45 y.o.   MRN: [id number]  HPI 45 yo WM   yowm never smoker with a history of chidlhod asthma & status asthmaticus requiring mechanical ventilation 4/97 at [facility]  He typically uses Symbicort, more on an as-needed basis rather than every day. He has only needed albuterol a few times in the past year.   March 21, 2009 c/o "still feels clot in leg = leg gets warm but not painful, never swells followed in coumadin clinic and wants to consider stopping after 6 months of rx. 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   May 15, 2009--Returns for 2 week follow up for HTN after rx with diovan.   June 26, 2009 ov 6 wk followup. Pt states that BP has been better with diovan. HA less, veritigo when rolls over in bed, no tinnitus, no ear aches, no nausea.   October 10, 2009--Presents for an acute office visit. Pt c/o left ankle swelling x 2 weeks after being hit with a weight. Pt has a hx of DVT in left leg s/p achilles tendon repair last year. follow up Doppler in 12/10 showed chronic clot. He is currently on coumadin. He was outside with dog 2 weeks ago and dog was tied to weight , dog took off and 25lb weight rolled into pt left lower leg and ankle. It is now swollen from foot to knee. Mild eccymosis noted. Last INR was 1.8. He denies chest pain or dyspnea. Lower leg tender. No significant pain w/ weight bearing. Denies chest pain, dyspnea, orthopnea, hemoptysis, fever, n/v/d.,   October 24, 2009-Presents for work in visit. Complains that he "pulled his hamstring on L leg" this past Saturday while trying to catch a ball playing baseball. Pt states he is taking acetaminophen and also using heat and alternating with ice on his leg but still c/o pain. Pt states he has "2 knots" on his leg where he pulled his muscle. Pt states he is able to extend and flex his leg but states it's "excrutiating". Ran to catch foul ball felt pop in back of leg.  Painful to bear weight Has swelling in thigh. Was seen 2 weeks ago for lower leg injury w/ weight. Underwent venous doppler which showed improved clot resolution in thigh but chronic clot remained in popliteal/peroneal. His lower leg edema has much improved and is back to baseline.Marland Kitchen He remains on coumadin. Last INR 1.8. Has follow up later this week. rec orthopedic eval.   November 07, 2009 ov 4 wk followup. Pt still c/o leg left foot edema. He also c/o pain in left leg from where he pulled hamstring x 2 wks ago. leg swelling was better when elevated, worse once back down. no cp, sob.>>coumadin continued    09/03/10 Follow up   Pt returns for follow up . He remains on coumadin. He is very frustrated that he has to go to coumadin clinic monthly and pay  $80. He has not been since 05/2010. Last doppler 09/2009 showed remaining DVT in lower leg. We discussed several options with him  Today. Also spoke with Orma Flaming D at [facility] regarding   Says he is doing fine. NO bloody stools, urine or sputum.   He is very active  ,plays ball . Does get bruising often.   Asthma under good control. Rare use of ventolin.  Takes symbicort most day , 1 puff Twice daily   >refer to vascular  04/19/2011 Acute OV  Complains of rash on both arms, chest and back; some swelling at the sites x4days - denies direct exposure to poison ivy/oak.  does not itch.  no new meds.  Did travel last week and stayed in hotel.  Some bumps on face. No dysphagia, sore throat or edema. No chest pain or wheezing.  Started zyrtec yesterday without resolution.  Now off coumadin after vascular consult in 11/2010 -option off coumadin due to left lower leg clot felt chronic . No calf pain or swelling.       Review of Systems Constitutional:   No  weight loss, night sweats,  Fevers, chills   HEENT:   No headaches,  Difficulty swallowing,  Tooth/dental problems, or  Sore throat,                No sneezing, itching, ear ache, nasal congestion,  post nasal drip,   CV:  No chest pain,  Orthopnea, PND, swelling in lower extremities, anasarca, dizziness, palpitations, syncope.   GI  No heartburn, indigestion, abdominal pain, nausea, vomiting, diarrhea, change in bowel habits, loss of appetite, bloody stools.   Resp: No shortness of breath with exertion or at rest.  No excess mucus, no productive cough,  No non-productive cough,  No coughing up of blood.  No change in color of mucus.  No wheezing.  No chest wall deformity  Skin: +rash  GU: no dysuria, change in color of urine, no urgency or frequency.  No flank pain, no hematuria   MS:  No joint pain or swelling.  No decreased range of motion.  No back pain.  Psych:  No change in mood or affect. No depression or anxiety.  No memory loss.         Objective:   Physical Exam GEN: A/Ox3; pleasant , NAD, well nourished   HEENT:  [place]/AT,  EACs-clear, TMs-wnl, NOSE-clear, THROAT-clear, no lesions, no postnasal drip or exudate noted.   NECK:  Supple w/ fair ROM; no JVD; normal carotid impulses w/o bruits; no thyromegaly or nodules palpated; no lymphadenopathy.  RESP  Clear  P & A; w/o, wheezes/ rales/ or rhonchi.no accessory muscle use, no dullness to percussion  CARD:  RRR, no m/r/g  , no peripheral edema, pulses intact, no cyanosis or clubbing. Mild venous insufficiency w/ ropey veins  GI:   Soft & nt; nml bowel sounds; no organomegaly or masses detected.  Musco: Warm bil, no deformities or joint swelling noted.   Neuro: alert, no focal deficits noted.    Skin: Warm, punctate bumps along arms, upper chest and back. Few scattered bumps on forehead.           Assessment & Plan:

## 2011-04-19 NOTE — Assessment & Plan Note (Signed)
?   Etiology ? Possible insect bites  Plan;  Zyrtec 10mg  At bedtime  For 1 week  Pepcid 20mg  At bedtime  For 1 week.  Prednisone taper over next week  Avoid hot showers Cool compresses As needed   OTC Hydrocortisone cream to rash x 3 days on arms  Please contact office for sooner follow up if symptoms do not improve or worsen or seek emergency care  Call back if not improving will refer to dermatology  follow up Dr. Sherene Sires in 1 month for physical.

## 2011-05-20 ENCOUNTER — Encounter: Payer: Self-pay | Admitting: Internal Medicine

## 2011-05-20 ENCOUNTER — Ambulatory Visit (INDEPENDENT_AMBULATORY_CARE_PROVIDER_SITE_OTHER): Payer: BC Managed Care – PPO | Admitting: Internal Medicine

## 2011-05-20 VITALS — BP 140/92 | HR 71 | Temp 97.8°F | Ht 72.0 in | Wt 221.8 lb

## 2011-05-20 DIAGNOSIS — Z23 Encounter for immunization: Secondary | ICD-10-CM

## 2011-05-20 DIAGNOSIS — Z0000 Encounter for general adult medical examination without abnormal findings: Secondary | ICD-10-CM

## 2011-05-20 DIAGNOSIS — I10 Essential (primary) hypertension: Secondary | ICD-10-CM

## 2011-05-20 DIAGNOSIS — J45909 Unspecified asthma, uncomplicated: Secondary | ICD-10-CM

## 2011-05-20 DIAGNOSIS — I82409 Acute embolism and thrombosis of unspecified deep veins of unspecified lower extremity: Secondary | ICD-10-CM

## 2011-05-20 DIAGNOSIS — E785 Hyperlipidemia, unspecified: Secondary | ICD-10-CM

## 2011-05-20 NOTE — Progress Notes (Signed)
  Subjective:    Patient ID: Jose Crane, male    DOB: Mar 30, 1966, 46 y.o.   MRN: [id number]  HPI  42 yowm never smoker with a history of chidlhod asthma & status asthmaticus requiring mechanical ventilation 4/97 at [facility].  He typically uses Symbicort, more on an as-needed basis rather than every day. He has only needed albuterol  rarely  March 21, 2009 c/o "still feels clot in leg = leg gets warm but not painful, never swells followed in coumadin clinic and wants to consider stopping after 6 months of rx. 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      05/20/2011 CPX/ Dimple Bastyr cc occ tingling in L leg, no def subj swelling, not consistent with symbicort but not needing saba either. Main symptoms are nasal congestion. No limiting sob but not very active  Sleeping ok without nocturnal  or early am exacerbation  of respiratory  c/o's or need for noct saba. Also denies any obvious fluctuation of symptoms with weather or environmental changes or other aggravating or alleviating factors except as outlined above    Past Medical History:  HYPERLIPIDEMIA (ICD-272.4)  ASTHMA (ICD-493.90)  HEALTH MAINTENANCE  - Pneumovax 02/2004 (second shot)  - Tdap 05/20/11 - CPX 05/20/11 Ruptured achilles April 2010  DVT Left leg sp achilles surgery July 2010  - Repeat doppler 04/10/09: Extensive chronic L Leg DVT  -repeat doppler 10/11/09---resolution of DVT in CFV and SFV, residual clot in popliteal and peroneal veins     Review of Systems  Constitutional: Negative for fever, chills, diaphoresis, activity change, appetite change, fatigue and unexpected weight change.  HENT: Positive for congestion and rhinorrhea. Negative for hearing loss, ear pain, nosebleeds, sore throat, facial swelling, sneezing, mouth sores, trouble swallowing, neck pain, neck stiffness, dental problem, voice change, postnasal drip, sinus pressure, tinnitus and ear discharge.   Eyes: Negative for photophobia, discharge, itching and visual  disturbance.  Respiratory: Negative for apnea, cough, choking, chest tightness, shortness of breath, wheezing and stridor.   Cardiovascular: Negative for chest pain, palpitations and leg swelling.  Gastrointestinal: Negative for nausea, vomiting, abdominal pain, constipation, blood in stool and abdominal distention.  Genitourinary: Negative for dysuria, urgency, frequency, hematuria, flank pain, decreased urine volume and difficulty urinating.  Musculoskeletal: Positive for myalgias and back pain. Negative for joint swelling, arthralgias and gait problem.  Skin: Negative for color change, pallor and rash.  Neurological: Negative for dizziness, tremors, seizures, syncope, speech difficulty, weakness, light-headedness, numbness and headaches.  Hematological: Negative for adenopathy. Bruises/bleeds easily.  Psychiatric/Behavioral: Positive for sleep disturbance. Negative for confusion and agitation. The patient is not nervous/anxious.        Objective:   Physical Exam     wt 209 March 14, 2008 > 216 May 15, 2009 >  > 222 November 07, 2009 > 221 05/20/2011  Ambulatory healthy appearing in no acute distress  HEENT: nl dentition, and orophanx.  Neck without JVD/Nodes/TM  Lungs trace exp wheeze without cough on insp or exp maneuvers  RRR no s3 or murmur or increase in P2  Abd soft and benign with nl excursion in the supine position. No bruits or organomegaly  Ext mild L leg swelling s edema, good pulses, neg Homan's Neuro alert, approp no deficits MS  Nl gait, no joint restrictions GU  Testes down bilat, neg IH      Assessment & Plan:

## 2011-05-20 NOTE — Patient Instructions (Signed)
Please see patient coordinator before you leave today  to schedule venous doppler  You need to avoid salt and reduce your weight by about 10-15 pounds to control your blood pressure better without the need for meds  Please remember to go to the lab tomorrow am  downstairs for your tests - we will call you with the results when they are available.

## 2011-05-21 ENCOUNTER — Telehealth: Payer: Self-pay | Admitting: *Deleted

## 2011-05-21 ENCOUNTER — Other Ambulatory Visit (INDEPENDENT_AMBULATORY_CARE_PROVIDER_SITE_OTHER): Payer: BC Managed Care – PPO

## 2011-05-21 ENCOUNTER — Telehealth: Payer: Self-pay | Admitting: Internal Medicine

## 2011-05-21 DIAGNOSIS — Z0000 Encounter for general adult medical examination without abnormal findings: Secondary | ICD-10-CM

## 2011-05-21 LAB — CBC WITH DIFFERENTIAL/PLATELET
Basophils Absolute: 0 10*3/uL (ref 0.0–0.1)
HCT: 47.1 % (ref 39.0–52.0)
Lymphocytes Relative: 30.1 % (ref 12.0–46.0)
Lymphs Abs: 2.2 10*3/uL (ref 0.7–4.0)
Monocytes Relative: 6.9 % (ref 3.0–12.0)
Neutrophils Relative %: 57.8 % (ref 43.0–77.0)
Platelets: 195 10*3/uL (ref 150.0–400.0)
RDW: 13.2 % (ref 11.5–14.6)

## 2011-05-21 LAB — LDL CHOLESTEROL, DIRECT: Direct LDL: 117.5 mg/dL

## 2011-05-21 LAB — URINALYSIS, ROUTINE W REFLEX MICROSCOPIC
Specific Gravity, Urine: 1.02 (ref 1.000–1.030)
Total Protein, Urine: NEGATIVE
Urine Glucose: NEGATIVE
Urobilinogen, UA: 0.2 (ref 0.0–1.0)
pH: 6 (ref 5.0–8.0)

## 2011-05-21 LAB — LIPID PANEL
HDL: 50.3 mg/dL (ref >39.00)
Total CHOL/HDL Ratio: 4
VLDL: 42 mg/dL — ABNORMAL HIGH (ref 0.0–40.0)

## 2011-05-21 LAB — BASIC METABOLIC PANEL
CO2: 28 mEq/L (ref 19–32)
Calcium: 9.6 mg/dL (ref 8.4–10.5)
Potassium: 4.8 mEq/L (ref 3.5–5.1)
Sodium: 139 mEq/L (ref 135–145)

## 2011-05-21 LAB — HEPATIC FUNCTION PANEL
Bilirubin, Direct: 0.1 mg/dL (ref 0.0–0.3)
Total Bilirubin: 0.5 mg/dL (ref 0.3–1.2)

## 2011-05-21 NOTE — Telephone Encounter (Signed)
Received telephone voice message today (05-21-2011) from Mr. Dente stating he was experiencing swelling behind his left knee. States he is off Coumadin and is taking an aspirin daily.  States he has venous doppler scheduled on 05-29-2011 at Coumadin clinic on [facility] and is currently under the care of Sandrea Hughs MD.   I reviewed Mr. Brosh records with Hedy Jacob RN.  Mr. Stucke was seen by Sandrea Hughs MD  Yesterday (05-20-2011) and no mentioned was made of swelling in back of left knee.  Telephoned Mr. Aker and he stated the swelling behind his left knee with no associated pain is a new finding since his visit with Sandrea Hughs MD yesterday.  Encouraged Mr. Imbert to call Dr. Thurston Hole office and inform them of swelling behind left knee (new symptom) and have his venous doppler rescheduled (moved up).  Mr. Welter verbalized understanding and states he will call Dr. Thurston Hole office right away and get venous doppler moved up. Zaylei Mullane, Neena Rhymes

## 2011-05-21 NOTE — Assessment & Plan Note (Signed)
All goals of chronic asthma control met including optimal function and elimination of symptoms with minimal need for rescue therapy.  Contingencies discussed in full including contacting this office immediately if not controlling the symptoms using the rule of two's.      He remains at high risk because of tendency to deny symptoms and previous hx of status asthmaticus

## 2011-05-21 NOTE — Telephone Encounter (Signed)
I spoke with pt and he states he wants to move his apt for his venous dopplers up bc he was originally going out of town and the reason he could not schedule the apt for yesterday or today. Pt states he cancelled this trip and wants to move his apt time up. I spoke with lbcd and scheduled pt for tomorrow at 12:30 and pt to arrive at 12:15. Pt is aware of this and needed nothing further

## 2011-05-21 NOTE — Assessment & Plan Note (Signed)
No evidence of recurrent dvt and pt declines further eval or rx but strongly rec he take a full aspirin daily and notify us immediately if he notices a change in the L leg

## 2011-05-22 ENCOUNTER — Encounter (INDEPENDENT_AMBULATORY_CARE_PROVIDER_SITE_OTHER): Payer: BC Managed Care – PPO | Admitting: Cardiology

## 2011-05-22 DIAGNOSIS — M79609 Pain in unspecified limb: Secondary | ICD-10-CM

## 2011-05-22 DIAGNOSIS — I82409 Acute embolism and thrombosis of unspecified deep veins of unspecified lower extremity: Secondary | ICD-10-CM

## 2011-05-22 DIAGNOSIS — Z86718 Personal history of other venous thrombosis and embolism: Secondary | ICD-10-CM

## 2011-05-22 DIAGNOSIS — M7989 Other specified soft tissue disorders: Secondary | ICD-10-CM

## 2011-05-22 NOTE — Progress Notes (Signed)
Quick Note:  Spoke with pt and notified of results per Dr. [name]. Pt verbalized understanding and denied any questions.  ______ 

## 2011-05-22 NOTE — Assessment & Plan Note (Signed)
Marginal Adequate control on present rx, reviewed need for salt avoidance and about 10-15 lb wt loss to control longterm

## 2011-05-22 NOTE — Assessment & Plan Note (Signed)
No results found for this basename: LDLCALC      Adequate control on present rx, reviewed

## 2011-05-24 ENCOUNTER — Encounter: Payer: Self-pay | Admitting: Internal Medicine

## 2011-05-27 NOTE — Progress Notes (Signed)
Quick Note:  Spoke with pt and notified of results per Dr. [name]. Pt verbalized understanding and denied any questions.  ______ 

## 2011-05-29 ENCOUNTER — Encounter: Payer: BC Managed Care – PPO | Admitting: Cardiology

## 2012-02-14 ENCOUNTER — Other Ambulatory Visit: Payer: Self-pay | Admitting: Adult Health

## 2012-02-14 ENCOUNTER — Other Ambulatory Visit: Payer: Self-pay | Admitting: Internal Medicine

## 2012-11-04 ENCOUNTER — Encounter: Payer: Self-pay | Admitting: Internal Medicine

## 2012-11-04 ENCOUNTER — Ambulatory Visit (INDEPENDENT_AMBULATORY_CARE_PROVIDER_SITE_OTHER): Payer: BC Managed Care – PPO | Admitting: Internal Medicine

## 2012-11-04 VITALS — BP 140/80 | HR 77 | Temp 98.5°F | Ht 71.0 in | Wt 196.4 lb

## 2012-11-04 DIAGNOSIS — S70929A Unspecified superficial injury of unspecified thigh, initial encounter: Secondary | ICD-10-CM

## 2012-11-04 DIAGNOSIS — J45909 Unspecified asthma, uncomplicated: Secondary | ICD-10-CM

## 2012-11-04 DIAGNOSIS — I82409 Acute embolism and thrombosis of unspecified deep veins of unspecified lower extremity: Secondary | ICD-10-CM

## 2012-11-04 DIAGNOSIS — S70919A Unspecified superficial injury of unspecified hip, initial encounter: Secondary | ICD-10-CM

## 2012-11-04 MED ORDER — BUDESONIDE-FORMOTEROL FUMARATE 160-4.5 MCG/ACT IN AERO: INHALATION_SPRAY | RESPIRATORY_TRACT | Status: DC

## 2012-11-04 NOTE — Assessment & Plan Note (Addendum)
09/03/2010 >decreased symbicort 80/4.20mcg  -  11/04/2012  > increased symbicort to 160 bid as not consistent with use  - hfa 90% p coaching 11/04/2012   Not meeting all goals of asthma on symbicort 80 largely due to inconsistent use. Reminded he is at high risk of asthma death     Each maintenance medication was reviewed in detail including most importantly the difference between maintenance and as needed and under what circumstances the prns are to be used.  Please see instructions for details which were reviewed in writing and the patient given a copy.

## 2012-11-04 NOTE — Assessment & Plan Note (Signed)
DVT Left leg after achilles surgery July 2010  - Repeat doppler 04/10/09: Extensive chronic L Leg DVT  -repeat doppler 10/11/09---resolution of DVT in CFV and SFV, residual clot in popliteal and peroneal veins  -repeat doppler 09/03/2010 >>residual clot in left proximal popliteal vein. >>refer to vascular  -OV w/ Dr. Hart Rochester 09/25/10  in Vascular w/ rec of possible trial off coumadin>>pt decided to stop coumadin      >>>Per phone conversation with pt on  02/19/2011 >>pt did not follow up with Dr. Sherene Sires  As recommended by Dr. Hart Rochester to discuss            Coumadin , he stopped on 11/28/10 and does not want to be on couamdin, Is taking an ASA daily.  Will call to make follow up ov with Dr. Sherene Sires              Later this year. [facility] notified- Cloyde Reams, RN. Pt advised of risks esp with immobility and travel. Pt verbalized understanding.  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

## 2012-11-04 NOTE — Patient Instructions (Addendum)
Increase symbicort 160 Take 2 puffs first thing in am and then another 2 puffs about 12 hours later.   Ok to use advil and aleve as needed for leg  Good working on [employer]   Return 04/2013 for CPX - call sooner if needed

## 2012-11-04 NOTE — Progress Notes (Signed)
  Subjective:    Patient ID: Jose Crane, male    DOB: 07/04/1965, 47 y.o.   MRN: [id number]  HPI  13 yowm never smoker with a history of chidlhod asthma & status asthmaticus requiring mechanical ventilation 4/97 at [facility].  He typically uses Symbicort, more on an as-needed basis rather than every day. He has only needed albuterol  rarely  March 21, 2009 c/o "still feels clot in leg = leg gets warm but not painful, never swells followed in coumadin clinic and wants to consider stopping after 6 months of rx. 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      05/20/2011 CPX/ Gerik Coberly cc occ tingling in L leg, no def subj swelling, not consistent with symbicort but not needing saba either. Main symptoms are nasal congestion. No limiting sob but not very active rec Work on wt loss   11/04/2012 acute ov/Renea Schoonmaker  Re leg injury/ asthma/ not using symbicort 80 consistently  Chief Complaint  Patient presents with  . Acute Visit    Pt c/o left leg pain after hit by a baseball 11 days ago.    pain is better, swelling better, did elevate and ice no nsaids, only tylenol.  No pain wt bearing, some pain with foot dorsiflexion.  "getting over a cold" and using saba once a week but not consistent at all with symbicort and did not take it am of ov   Sleeping ok without nocturnal  or early am exacerbation  of respiratory  c/o's or need for noct saba. Also denies any obvious fluctuation of symptoms with weather or environmental changes or other aggravating or alleviating factors except as outlined above      Past Medical History:  HYPERLIPIDEMIA (ICD-272.4)  ASTHMA (ICD-493.90)  HEALTH MAINTENANCE  - Pneumovax 02/2004 (second shot)  - Tdap 05/20/11 - CPX 05/20/11 Ruptured achilles April 2010  DVT Left leg sp achilles surgery July 2010  - Repeat doppler 04/10/09: Extensive chronic L Leg DVT  -repeat doppler 10/11/09---resolution of DVT in CFV and SFV, residual clot in popliteal and peroneal veins        Objective:   Physical Exam    wt 209 March 14, 2008 > 216 May 15, 2009 >  > 222 November 07, 2009 > 221 05/20/2011 > 11/04/2012 196  Ambulatory healthy appearing in no acute distress  HEENT: nl dentition, and orophanx.  Neck without JVD/Nodes/TM  Lungs mid bilateral  exp wheeze without cough on insp or exp maneuvers  RRR no s3 or murmur or increase in P2  Abd soft and benign with nl excursion in the supine position. No bruits or organomegaly  Ext   good pulses, neg Homan's Neuro alert, approp no deficits MS  Silver dollar sized hematoma, very mild swelling L vs R leg      Assessment & Plan:

## 2012-11-04 NOTE — Assessment & Plan Note (Signed)
-   hit by baseball around Jul1 2014> L shin hematoma  Silver dollar sized > rx elevation and nsaids

## 2012-12-16 ENCOUNTER — Other Ambulatory Visit: Payer: Self-pay | Admitting: *Deleted

## 2012-12-16 MED ORDER — ALBUTEROL SULFATE HFA 108 (90 BASE) MCG/ACT IN AERS: 2.0000 | INHALATION_SPRAY | Freq: Four times a day (QID) | RESPIRATORY_TRACT | Status: DC | PRN

## 2013-05-10 ENCOUNTER — Telehealth: Payer: Self-pay | Admitting: Internal Medicine

## 2013-05-10 MED ORDER — BUDESONIDE-FORMOTEROL FUMARATE 160-4.5 MCG/ACT IN AERO: INHALATION_SPRAY | RESPIRATORY_TRACT | Status: DC

## 2013-05-10 NOTE — Telephone Encounter (Signed)
Advised pt have sent rx for symbicort 160 sent to pharm. Also, advised needs appointment. Nothing further needed

## 2013-05-17 ENCOUNTER — Other Ambulatory Visit: Payer: Self-pay | Admitting: Internal Medicine

## 2013-05-17 MED ORDER — BUDESONIDE-FORMOTEROL FUMARATE 160-4.5 MCG/ACT IN AERO: INHALATION_SPRAY | RESPIRATORY_TRACT | Status: DC

## 2013-05-28 ENCOUNTER — Ambulatory Visit (INDEPENDENT_AMBULATORY_CARE_PROVIDER_SITE_OTHER)
Admission: RE | Admit: 2013-05-28 | Discharge: 2013-05-28 | Disposition: A | Discharge status: Home / Self Care | Payer: BC Managed Care – PPO | Source: Ambulatory Visit | Attending: Internal Medicine | Admitting: Internal Medicine

## 2013-05-28 ENCOUNTER — Other Ambulatory Visit (INDEPENDENT_AMBULATORY_CARE_PROVIDER_SITE_OTHER): Payer: BC Managed Care – PPO

## 2013-05-28 ENCOUNTER — Ambulatory Visit (INDEPENDENT_AMBULATORY_CARE_PROVIDER_SITE_OTHER): Payer: BC Managed Care – PPO | Admitting: Internal Medicine

## 2013-05-28 ENCOUNTER — Encounter: Payer: Self-pay | Admitting: Internal Medicine

## 2013-05-28 VITALS — BP 136/84 | HR 70 | Temp 98.1°F | Ht 71.0 in | Wt 204.0 lb

## 2013-05-28 DIAGNOSIS — I82409 Acute embolism and thrombosis of unspecified deep veins of unspecified lower extremity: Secondary | ICD-10-CM

## 2013-05-28 DIAGNOSIS — Z0000 Encounter for general adult medical examination without abnormal findings: Secondary | ICD-10-CM

## 2013-05-28 DIAGNOSIS — J45909 Unspecified asthma, uncomplicated: Secondary | ICD-10-CM

## 2013-05-28 DIAGNOSIS — E785 Hyperlipidemia, unspecified: Secondary | ICD-10-CM

## 2013-05-28 LAB — BASIC METABOLIC PANEL
BUN: 23 mg/dL (ref 6–23)
CHLORIDE: 102 meq/L (ref 96–112)
CO2: 26 mEq/L (ref 19–32)
Calcium: 9.6 mg/dL (ref 8.4–10.5)
Creatinine, Ser: 1.2 mg/dL (ref 0.4–1.5)
GFR: 72.93 mL/min (ref >60.00)
Glucose, Bld: 79 mg/dL (ref 70–99)
Potassium: 3.9 mEq/L (ref 3.5–5.1)
SODIUM: 134 meq/L — AB (ref 135–145)

## 2013-05-28 LAB — LIPID PANEL
CHOL/HDL RATIO: 4
CHOLESTEROL: 242 mg/dL — AB (ref 0–200)
HDL: 57.6 mg/dL (ref >39.00)
TRIGLYCERIDES: 128 mg/dL (ref 0.0–149.0)
VLDL: 25.6 mg/dL (ref 0.0–40.0)

## 2013-05-28 LAB — HEPATIC FUNCTION PANEL
ALBUMIN: 4.2 g/dL (ref 3.5–5.2)
ALT: 45 U/L (ref 0–53)
AST: 26 U/L (ref 0–37)
Alkaline Phosphatase: 39 U/L (ref 39–117)
BILIRUBIN TOTAL: 0.8 mg/dL (ref 0.3–1.2)
Bilirubin, Direct: 0.1 mg/dL (ref 0.0–0.3)
TOTAL PROTEIN: 7.3 g/dL (ref 6.0–8.3)

## 2013-05-28 LAB — URINALYSIS
Bilirubin Urine: NEGATIVE
Ketones, ur: NEGATIVE
Leukocytes, UA: NEGATIVE
Nitrite: NEGATIVE
PH: 6 (ref 5.0–8.0)
SPECIFIC GRAVITY, URINE: 1.02 (ref 1.000–1.030)
Total Protein, Urine: NEGATIVE
URINE GLUCOSE: NEGATIVE
Urobilinogen, UA: 0.2 (ref 0.0–1.0)

## 2013-05-28 LAB — CBC WITH DIFFERENTIAL/PLATELET
BASOS PCT: 0.7 % (ref 0.0–3.0)
Basophils Absolute: 0 10*3/uL (ref 0.0–0.1)
EOS PCT: 6.5 % — AB (ref 0.0–5.0)
Eosinophils Absolute: 0.5 10*3/uL (ref 0.0–0.7)
HEMATOCRIT: 47.5 % (ref 39.0–52.0)
HEMOGLOBIN: 15.9 g/dL (ref 13.0–17.0)
LYMPHS ABS: 2.1 10*3/uL (ref 0.7–4.0)
Lymphocytes Relative: 29.9 % (ref 12.0–46.0)
MCHC: 33.4 g/dL (ref 30.0–36.0)
MCV: 90.8 fl (ref 78.0–100.0)
MONO ABS: 0.5 10*3/uL (ref 0.1–1.0)
MONOS PCT: 6.5 % (ref 3.0–12.0)
NEUTROS ABS: 3.9 10*3/uL (ref 1.4–7.7)
Neutrophils Relative %: 56.4 % (ref 43.0–77.0)
Platelets: 180 10*3/uL (ref 150.0–400.0)
RBC: 5.23 Mil/uL (ref 4.22–5.81)
RDW: 13.4 % (ref 11.5–14.6)
WBC: 7 10*3/uL (ref 4.5–10.5)

## 2013-05-28 LAB — TSH: TSH: 1.61 u[IU]/mL (ref 0.35–5.50)

## 2013-05-28 LAB — LDL CHOLESTEROL, DIRECT: LDL DIRECT: 165.9 mg/dL

## 2013-05-28 NOTE — Progress Notes (Signed)
Subjective:    Patient ID: Jose Crane, male    DOB: Mar 04, 1966    MRN: [id number]    Brief patient profile:  49 yowm never smoker with a history of chidlhod asthma & status asthmaticus requiring mechanical ventilation 4/97 at [facility].  He typically uses Symbicort, more on an as-needed basis rather than every day. He has only needed albuterol  rarely   History of Present Illness  March 21, 2009 c/o "still feels clot in leg = leg gets warm but not painful, never swells followed in coumadin clinic and wants to consider stopping after 6 months of rx. 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      05/20/2011 CPX/ Jose Crane cc occ tingling in L leg, no def subj swelling, not consistent with symbicort but not needing saba either. Main symptoms are nasal congestion. No limiting sob but not very active rec Work on wt loss   11/04/2012 acute ov/Jose Crane  Re leg injury/ asthma/ not using symbicort 80 consistently  Chief Complaint  Patient presents with  . Acute Visit    Pt c/o left leg pain after hit by a baseball 11 days ago.    pain is better, swelling better, did elevate and ice no nsaids, only tylenol.  No pain wt bearing, some pain with foot dorsiflexion.  "getting over a cold" and using saba once a week but not consistent at all with symbicort and did not take it am of ov.  05/28/2013 f/u ov/Jose Crane re: asthma/ dvt  Chief Complaint  Patient presents with  . Annual Exam    Pt states doing well and denies any co's today      No obvious day to day or daytime variabilty or assoc chronic cough or cp or chest tightness, subjective wheeze overt sinus or hb symptoms. No unusual exp hx or h/o childhood pna/ asthma or knowledge of premature birth.  Sleeping ok without nocturnal  or early am exacerbation  of respiratory  c/o's or need for noct saba. Also denies any obvious fluctuation of symptoms with weather or environmental changes or other aggravating or alleviating factors except as outlined above    Current Medications, Allergies, Complete Past Medical History, Past Surgical History, Family History, and Social History were reviewed in [employer] record.  ROS  The following are not active complaints unless bolded sore throat, dysphagia, dental problems, itching, sneezing,  nasal congestion or excess/ purulent secretions, ear ache,   fever, chills, sweats, unintended wt loss, pleuritic or exertional cp, hemoptysis,  orthopnea pnd or leg swelling, presyncope, palpitations, heartburn, abdominal pain, anorexia, nausea, vomiting, diarrhea  or change in bowel or urinary habits, change in stools or urine, dysuria,hematuria,  rash, arthralgias, visual complaints, headache, numbness weakness or ataxia or problems with walking or coordination,  change in mood/affect or memory.           Past Medical History:  HYPERLIPIDEMIA (ICD-272.4)  ASTHMA (ICD-493.90)  HEALTH MAINTENANCE .............................. Jose Crane - Pneumovax 02/2004 (second shot)  - Tdap 05/20/11 - CPX 05/28/2013  Ruptured achilles April 2010  DVT Left leg sp achilles surgery July 2010  - Repeat doppler 04/10/09: Extensive chronic L Leg DVT  -repeat doppler 10/11/09---resolution of DVT in CFV and SFV, residual clot in popliteal and peroneal veins       Objective:   Physical Exam    wt 209 March 14, 2008 > 216 May 15, 2009 >  > 222 November 07, 2009 > 221 05/20/2011 > 11/04/2012 196 > 05/28/2013  204   Ambulatory healthy appearing in no acute distress  HEENT: nl dentition, and orophanx.  Neck without JVD/Nodes/TM  Lungs  Min bilateral end  exp wheeze better with plm, without cough on insp or exp maneuvers  RRR no s3 or murmur or increase in P2  Abd soft and benign with nl excursion in the supine position. No bruits or organomegaly  Ext   good pulses, neg Homan's Neuro alert, approp no deficits MS   Nl gait, no restrictions SKIN  Multiple nevi largest L post trunk still < pencil eraser, spherical  and homogeneous with smooth borders, mild venous insuff changes L Lower ext GU testes down bilaterally Rectal mild bph smooth, stool g neg   cxr 05/28/12 Heart size and mediastinal contours are within normal limits. Both  lungs are clear. Visualized skeletal structures are unremarkable.           Assessment & Plan:

## 2013-05-28 NOTE — Patient Instructions (Signed)
Continue symbicort 160 Take 2 puffs first thing in am and then another 2 puffs about 12 hours later.    Only use your albuterol as a rescue medication to be used if you can't catch your breath by resting or doing a relaxed purse lip breathing pattern.  - The less you use it, the better it will work when you need it. - Ok to use up to 2 puffs  every 4 hours if you must but call for immediate appointment if use goes up over your usual need - Don't leave home without it !!  (think of it like the spare tire for your car)   Weight control is simply a matter of calorie balance which needs to be tilted in your favor by eating less and exercising more.  To get the most out of exercise, you need to be continuously aware that you are short of breath, but never out of breath, for minimum of 30 minutes 3 x weekly.   As you improve, it will actually be easier for you to do the same amount of exercise  in  30 minutes so always push to the level where you are short of breath.  If this does not result in gradual weight reduction then I strongly recommend you see a nutritionist with a food diary x 2 weeks so that we can work out a negative calorie balance which is universally effective in steady weight loss programs.  Think of your calorie balance like you do your bank account where in this case you want the balance to go down so you must take in less calories than you burn up.  It's just that simple:  Hard to do, but easy to understand.  Good luck!   Please remember to go to the lab and x-ray department downstairs for your tests - we will call you with the results when they are available.  Please schedule a follow up visit in 6 months but call sooner if needed

## 2013-05-29 ENCOUNTER — Encounter: Payer: Self-pay | Admitting: Internal Medicine

## 2013-05-29 NOTE — Assessment & Plan Note (Signed)
DVT Left leg after achilles surgery July 2010  - Repeat doppler 04/10/09: Extensive chronic L Leg DVT  -repeat doppler 10/11/09---resolution of DVT in CFV and SFV, residual clot in popliteal and peroneal veins  -repeat doppler 09/03/2010 >>residual clot in left proximal popliteal vein. >>refer to vascular  -OV w/ Dr. Kellie Simmering 09/25/10  in Vascular w/ rec of possible trial off coumadin>>pt decided to stop coumadin      >>>Per phone conversation with pt on  02/19/2011 >>pt did not follow up with Dr. Melvyn Novas  As recommended by Dr. Kellie Simmering to discuss            Coumadin , he stopped on 11/28/10 and does not want to be on couamdin, Is taking an ASA daily. [facility] notified- [name], RN. Pt advised of risks esp with immobility and travel. Pt verbalized understanding.  - Full adult aspirin daily 05/20/11  Adequate control on present rx, reviewed > no change in rx needed

## 2013-05-29 NOTE — Assessment & Plan Note (Signed)
-   Goal LDL < 130 as no fm hx known  Lab Results  Component Value Date   CHOL 242* 05/28/2013   HDL 57.60 05/28/2013   LDLDIRECT 165.9 05/28/2013   TRIG 128.0 05/28/2013   CHOLHDL 4 05/28/2013     The elevated hdl is somewhat protective but would like to see the LDL much lower  rec diet/ ex/ f/u yearly for now

## 2013-05-29 NOTE — Assessment & Plan Note (Signed)
All shots/ health maint up to date. Advised re what to look for for moles that need evaluation

## 2013-05-29 NOTE — Assessment & Plan Note (Signed)
09/03/2010 >decreased symbicort 80/4.47mcg  -  11/04/2012  > increased symbicort gto 160 bid as not consistent with use  - hfa 90% p coaching 11/04/2012  - Eos 6.5% 05/28/13   Due to admitted non-adherence, marginally Adequate control on present rx, reviewed > no change in rx needed , just better adherence

## 2013-05-31 NOTE — Progress Notes (Signed)
Quick Note:  Spoke with pt and notified of results per Dr. [name]. Pt verbalized understanding and denied any questions.  ______ 

## 2013-06-29 ENCOUNTER — Telehealth: Payer: Self-pay | Admitting: Internal Medicine

## 2013-06-29 MED ORDER — ALBUTEROL SULFATE HFA 108 (90 BASE) MCG/ACT IN AERS: 2.0000 | INHALATION_SPRAY | Freq: Four times a day (QID) | RESPIRATORY_TRACT | Status: DC | PRN

## 2013-06-29 NOTE — Telephone Encounter (Signed)
Rx has been sent in to [facility]. Pt is aware. Nothing further was needed.

## 2013-10-04 ENCOUNTER — Other Ambulatory Visit: Payer: Self-pay | Admitting: Internal Medicine

## 2013-10-04 ENCOUNTER — Telehealth: Payer: Self-pay | Admitting: Internal Medicine

## 2013-10-04 MED ORDER — ALBUTEROL SULFATE HFA 108 (90 BASE) MCG/ACT IN AERS: 2.0000 | INHALATION_SPRAY | Freq: Four times a day (QID) | RESPIRATORY_TRACT | Status: DC | PRN

## 2013-10-04 NOTE — Telephone Encounter (Signed)
Rxs were refilled I spoke with the pt and notified  Nothing further needed

## 2013-11-24 ENCOUNTER — Encounter: Payer: Self-pay | Admitting: Internal Medicine

## 2013-11-24 ENCOUNTER — Ambulatory Visit (INDEPENDENT_AMBULATORY_CARE_PROVIDER_SITE_OTHER): Payer: BC Managed Care – PPO | Admitting: Internal Medicine

## 2013-11-24 VITALS — BP 142/86 | HR 62 | Temp 98.9°F | Ht 71.75 in | Wt 206.2 lb

## 2013-11-24 DIAGNOSIS — I82409 Acute embolism and thrombosis of unspecified deep veins of unspecified lower extremity: Secondary | ICD-10-CM

## 2013-11-24 DIAGNOSIS — Z23 Encounter for immunization: Secondary | ICD-10-CM

## 2013-11-24 DIAGNOSIS — Z0000 Encounter for general adult medical examination without abnormal findings: Secondary | ICD-10-CM

## 2013-11-24 DIAGNOSIS — J45909 Unspecified asthma, uncomplicated: Secondary | ICD-10-CM

## 2013-11-24 MED ORDER — PREDNISONE 10 MG PO TABS: ORAL_TABLET | ORAL | Status: DC

## 2013-11-24 NOTE — Patient Instructions (Addendum)
Prevnar 13 today   Prednisone 10 mg take  4 each am x 2 days,   2 each am x 2 days,  1 each am x 2 days and stop

## 2013-11-24 NOTE — Progress Notes (Signed)
Subjective:    Patient ID: Jose Crane, male    DOB: 1965-11-08    MRN: [id number]    Brief patient profile:  89 yowm never smoker with a history of chidlhod asthma & status asthmaticus requiring mechanical ventilation 4/97 at [facility].  He typically uses Symbicort, more on an as-needed basis rather than every day. He has only needed albuterol  rarely   History of Present Illness  March 21, 2009 c/o "still feels clot in leg = leg gets warm but not painful, never swells followed in coumadin clinic and wants to consider stopping after 6 months of rx. 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      05/20/2011 CPX/ Jose Crane cc occ tingling in L leg, no def subj swelling, not consistent with symbicort but not needing saba either. Main symptoms are nasal congestion. No limiting sob but not very active rec Work on wt loss   11/04/2012 acute ov/Jose Crane  Re leg injury/ asthma/ not using symbicort 80 consistently  Chief Complaint  Patient presents with  . Acute Visit    Pt c/o left leg pain after hit by a baseball 11 days ago.    pain is better, swelling better, did elevate and ice no nsaids, only tylenol.  No pain wt bearing, some pain with foot dorsiflexion.  "getting over a cold" and using saba once a week but not consistent at all with symbicort and did not take it am of ov.  05/28/2013 f/u ov/Jose Crane re: asthma/ dvt  Chief Complaint  Patient presents with  . Annual Exam    Pt states doing well and denies any co's today  rec symbicort 160 2bid plus prn saba Work on wt   7/292015 f/u ov/Jose Crane re: asthma/dvt on ASA only  Chief Complaint  Patient presents with  . Follow-up    Pt states that he has no major complaints. Still raspy--feels voice is more raspy than normal.     Rare need for saba, not using symbicort 160 consistently  No obvious day to day or daytime variabilty or assoc chronic cough or cp or chest tightness, subjective wheeze overt sinus or hb symptoms. No unusual exp hx or h/o  childhood pna/ asthma or knowledge of premature birth.  Sleeping ok without nocturnal  or early am exacerbation  of respiratory  c/o's or need for noct saba. Also denies any obvious fluctuation of symptoms with weather or environmental changes or other aggravating or alleviating factors except as outlined above   Current Medications, Allergies, Complete Past Medical History, Past Surgical History, Family History, and Social History were reviewed in [employer] record.  ROS  The following are not active complaints unless bolded sore throat, dysphagia, dental problems, itching, sneezing,  nasal congestion or excess/ purulent secretions, ear ache,   fever, chills, sweats, unintended wt loss, pleuritic or exertional cp, hemoptysis,  orthopnea pnd or leg swelling, presyncope, palpitations, heartburn, abdominal pain, anorexia, nausea, vomiting, diarrhea  or change in bowel or urinary habits, change in stools or urine, dysuria,hematuria,  rash, arthralgias, visual complaints, headache, numbness weakness or ataxia or problems with walking or coordination,  change in mood/affect or memory.                     Past Medical History:  HYPERLIPIDEMIA (ICD-272.4)  ASTHMA (ICD-493.90)  HEALTH MAINTENANCE .............................. Tommie Dejoseph - Pneumovax 02/2004 (second shot) and prevnar 13 11/24/2013  - Tdap 05/20/11 - CPX 05/28/2013  Ruptured achilles April 2010  DVT Left  leg sp achilles surgery July 2010  - Repeat doppler 04/10/09: Extensive chronic L Leg DVT  -repeat doppler 10/11/09---resolution of DVT in CFV and SFV, residual clot in popliteal and peroneal veins       Objective:   Physical Exam    wt 209 March 14, 2008 > 216 May 15, 2009 >  > 222 November 07, 2009 > 221 05/20/2011 > 11/04/2012 196 > 05/28/2013  204 > 11/24/2013  206   Ambulatory healthy appearing in no acute distress  HEENT: nl dentition, and orophanx.  Neck without JVD/Nodes/TM  Lungs  Min bilateral end   exp wheeze better with plm, without cough on insp or exp maneuvers  RRR no s3 or murmur or increase in P2  Abd soft and benign with nl excursion in the supine position. No bruits or organomegaly  Ext   good pulses, neg Homan's Neuro alert, approp no deficits MS   Nl gait, no restrictions SKIN  Multiple nevi largest L post trunk still < pencil eraser, spherical and homogeneous with smooth borders, mild venous insuff changes L Lower ext   cxr 05/28/12 Heart size and mediastinal contours are within normal limits. Both  lungs are clear. Visualized skeletal structures are unremarkable.           Assessment & Plan:

## 2013-11-25 NOTE — Assessment & Plan Note (Signed)
DVT Left leg after achilles surgery July 2010  - Repeat doppler 04/10/09: Extensive chronic L Leg DVT  -repeat doppler 10/11/09---resolution of DVT in CFV and SFV, residual clot in popliteal and peroneal veins  -repeat doppler 09/03/2010 >>residual clot in left proximal popliteal vein. >>refer to vascular  -OV w/ Dr. Kellie Simmering 09/25/10  in Vascular w/ rec of possible trial off coumadin>>pt decided to stop coumadin      >>>Per phone conversation with pt on  02/19/2011 >>pt did not follow up with Dr. Melvyn Novas  As recommended by Dr. Kellie Simmering to discuss            Coumadin , he stopped on 11/28/10 and does not want to be on couamdin, Is taking an ASA daily. [facility] notified- [name], RN. Pt advised of risks esp with immobility and travel. Pt verbalized understanding.  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

## 2013-11-25 NOTE — Assessment & Plan Note (Signed)
-   Prevnar 13 given - needs to keep working on wt.

## 2013-11-25 NOTE — Assessment & Plan Note (Signed)
09/03/2010 >decreased symbicort 80/4.53mcg  -  11/04/2012  > increased symbicort gto 160 bid as not consistent with use  - hfa 90% p coaching 11/04/2012   I had an extended discussion with the patient today lasting 15 to 20 minutes of a 25 minute visit on the following issues:   He needs to understand the two components of symbicort, one to treat the symptoms, the other to treat the problem, and needs to be more consistent with 2bid dosing  The proper method of use, as well as anticipated side effects, of a metered-dose inhaler are discussed and demonstrated to the patient. Improved effectiveness after extensive coaching during this visit to a level of approximately  90%    Each maintenance medication was reviewed in detail including most importantly the difference between maintenance and as needed and under what circumstances the prns are to be used.  Please see instructions for details which were reviewed in writing and the patient given a copy.

## 2014-02-16 ENCOUNTER — Other Ambulatory Visit: Payer: Self-pay | Admitting: Internal Medicine

## 2014-02-21 ENCOUNTER — Telehealth: Payer: Self-pay | Admitting: Internal Medicine

## 2014-02-21 MED ORDER — BUDESONIDE-FORMOTEROL FUMARATE 160-4.5 MCG/ACT IN AERO: INHALATION_SPRAY | RESPIRATORY_TRACT | Status: DC

## 2014-02-21 NOTE — Telephone Encounter (Signed)
[facility] - Symbicort 113mcg #3 (90 day supply) Pt aware.  Nothing further needed.

## 2014-03-02 ENCOUNTER — Telehealth: Payer: Self-pay | Admitting: Internal Medicine

## 2014-03-02 DIAGNOSIS — L84 Corns and callosities: Secondary | ICD-10-CM

## 2014-03-02 NOTE — Telephone Encounter (Signed)
I called spoke with and wants referral to podiatry. Referral placed

## 2014-03-02 NOTE — Telephone Encounter (Signed)
Called spoke with pt. He believes he has calluses or corns on feet. Wants to know if MW can see him for this (pt reports he is his PCP) or does he need referral? Please advise MW thanks

## 2014-03-02 NOTE — Telephone Encounter (Signed)
Refer to Dermatology or podiatry, whichever he prefers

## 2014-03-02 NOTE — Telephone Encounter (Signed)
[id number] x1 

## 2014-03-02 NOTE — Telephone Encounter (Signed)
Pt returning call.Jose Crane ° °

## 2014-03-14 ENCOUNTER — Ambulatory Visit (INDEPENDENT_AMBULATORY_CARE_PROVIDER_SITE_OTHER): Payer: BC Managed Care – PPO | Admitting: Podiatry

## 2014-03-14 ENCOUNTER — Encounter: Payer: Self-pay | Admitting: Podiatry

## 2014-03-14 VITALS — BP 146/92 | HR 64 | Resp 14 | Ht 72.0 in | Wt 195.0 lb

## 2014-03-14 DIAGNOSIS — Q828 Other specified congenital malformations of skin: Secondary | ICD-10-CM

## 2014-03-14 NOTE — Progress Notes (Signed)
   Subjective:    Patient ID: Jose Crane, male    DOB: [birth year], 48 y.o.   MRN: [id number]  HPI Comments: N Callouses L B/L 1, 5 MPJ plantar, and B/L 1st toes, and left plantar heel D years O painful in last 6 weeks C hard pain thick sking A walking barefoot T picking the areas     Review of Systems  All other systems reviewed and are negative.      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 If patient was not able to obtain adequate relief from periodic debridement would also add on custom foot orthotics with pockets  Debrided keratoses right and left without a bleeding  Reappoint at patient's request

## 2014-03-14 NOTE — Patient Instructions (Signed)
Corns and Calluses Corns are small areas of thickened skin that usually occur on the top, sides, or tip of a toe. They contain a cone-shaped core with a point that can press on a nerve below. This causes pain. Calluses are areas of thickened skin that usually develop on hands, fingers, palms, soles of the feet, and heels. These are areas that experience frequent friction or pressure. CAUSES  Corns are usually the result of rubbing (friction) or pressure from shoes that are too tight or do not fit properly. Calluses are caused by repeated friction and pressure on the affected areas. SYMPTOMS  A hard growth on the skin.  Pain or tenderness under the skin.  Sometimes, redness and swelling.  Increased discomfort while wearing tight-fitting shoes. DIAGNOSIS  Your caregiver can usually tell what the problem is by doing a physical exam. TREATMENT  Removing the cause of the friction or pressure is usually the only treatment needed. However, sometimes medicines can be used to help soften the hardened, thickened areas. These medicines include salicylic acid plasters and 12% ammonium lactate lotion. These medicines should only be used under the direction of your caregiver. HOME CARE INSTRUCTIONS   Try to remove pressure from the affected area.  You may wear donut-shaped corn pads to protect your skin.  You may use a pumice stone or nonmetallic nail file to gently reduce the thickness of a corn.  Wear properly fitted footwear.  If you have calluses on the hands, wear gloves during activities that cause friction.  If you have diabetes, you should regularly examine your feet. Tell your caregiver if you notice any problems with your feet. SEEK IMMEDIATE MEDICAL CARE IF:   You have increased pain, swelling, redness, or warmth in the affected area.  Your corn or callus starts to drain fluid or bleeds.  You are not getting better, even with treatment. Document Released: 01/20/2004 Document  Revised: 07/08/2011 Document Reviewed: 12/11/2010 ExitCare Patient Information 2015 [employer]. This information is not intended to replace advice given to you by your health care provider. Make sure you discuss any questions you have with your health care provider.  

## 2014-03-15 ENCOUNTER — Encounter: Payer: Self-pay | Admitting: Podiatry

## 2014-08-09 ENCOUNTER — Telehealth: Payer: Self-pay | Admitting: Internal Medicine

## 2014-08-09 ENCOUNTER — Other Ambulatory Visit: Payer: Self-pay | Admitting: Internal Medicine

## 2014-08-09 MED ORDER — BUDESONIDE-FORMOTEROL FUMARATE 160-4.5 MCG/ACT IN AERO: 2.0000 | INHALATION_SPRAY | Freq: Two times a day (BID) | RESPIRATORY_TRACT | Status: DC

## 2014-08-09 NOTE — Telephone Encounter (Signed)
Spoke with pt, requesting a symbicort discount card and a refill on symbicort.  This refill has been sent and rx card has been placed up front for pt pickup.  Nothing further needed.

## 2015-02-15 ENCOUNTER — Encounter: Payer: Self-pay | Admitting: Podiatry

## 2015-02-15 ENCOUNTER — Ambulatory Visit (INDEPENDENT_AMBULATORY_CARE_PROVIDER_SITE_OTHER): Payer: BLUE CROSS/BLUE SHIELD | Admitting: Podiatry

## 2015-02-15 VITALS — BP 141/84 | HR 65 | Resp 12

## 2015-02-15 DIAGNOSIS — L84 Corns and callosities: Secondary | ICD-10-CM | POA: Diagnosis not present

## 2015-02-15 NOTE — Progress Notes (Signed)
   Subjective:    Patient ID: Jose Crane, male    DOB: 07-15-1965, 49 y.o.   MRN: [id number]  HPI This patient presents today complaining of plantar calluses and right and left feet for the past month. These were last debrided on the visit of 03/14/2014. Patient states that he walks on hard floors without shoes and wears flip-flops occasionally.   Review of Systems  Musculoskeletal: Positive for gait problem.       Objective:   Physical Exam   orientated 3  Vascular: DP and PT pulses 2/4 bilaterally  Neurological: Deferred  Dermatological: Plantar keratoses first and fifth MPJ bilaterally Keratoses medial hallux bilaterally      Assessment & Plan:   Assessment: Plantar keratoses 6  Plan: Debride keratoses 6 without any bleeding Patient advised to use a pumice stone after showering Recommended shoeing when walking on hard floors Discussed the use of orthotics if patient did not obtain adequately relief from debridement  Reappoint at patient's request

## 2015-02-15 NOTE — Patient Instructions (Signed)
When you were at home walking on hardwood floors wear shoes or your foot flops at all times Okay to use a pumice stone after showering on the callused areas on the right and left feet Return as needed

## 2015-03-30 ENCOUNTER — Other Ambulatory Visit: Payer: Self-pay | Admitting: Internal Medicine

## 2015-09-13 ENCOUNTER — Encounter: Payer: Self-pay | Admitting: Internal Medicine

## 2015-09-13 ENCOUNTER — Ambulatory Visit (INDEPENDENT_AMBULATORY_CARE_PROVIDER_SITE_OTHER): Payer: BLUE CROSS/BLUE SHIELD | Admitting: Internal Medicine

## 2015-09-13 VITALS — BP 150/92 | HR 64 | Ht 72.0 in | Wt 199.0 lb

## 2015-09-13 DIAGNOSIS — R599 Enlarged lymph nodes, unspecified: Secondary | ICD-10-CM

## 2015-09-13 DIAGNOSIS — R590 Localized enlarged lymph nodes: Secondary | ICD-10-CM

## 2015-09-13 DIAGNOSIS — J4540 Moderate persistent asthma, uncomplicated: Secondary | ICD-10-CM | POA: Diagnosis not present

## 2015-09-13 NOTE — Patient Instructions (Signed)
No change in medications  Please schedule a follow up visit in 3 months but call sooner if needed  

## 2015-09-13 NOTE — Progress Notes (Signed)
Subjective:    Patient ID: Jose Crane, male    DOB: [birth year]    MRN: [id number]    Brief patient profile:  70 yowm never smoker with a history of childhood asthma & status asthmaticus requiring mechanical ventilation 4/97 at [facility]  He typically uses Symbicort, more on an as-needed basis rather than every day. He has only needed albuterol  rarely   History of Present Illness  09/13/2015  f/u ov/Danile Trier re:  Chronic asthma on symbicort 160 2 each am/very rare saba  Chief Complaint  Patient presents with  . Follow-up    Pt c/o "sensation" on the left side of his neck x 2 months.   area on neck is actually just under the mandible where he had recent dental surgery and denies ongoing dental concerns, pain, fever or chewing issues. No assoc ear ache or sinus problems    No obvious day to day or daytime variabilty or assoc chronic cough or cp or chest tightness, subjective wheeze overt sinus or hb symptoms. No unusual exp hx or h/o childhood pna or knowledge of premature birth.  Sleeping ok without nocturnal  or early am exacerbation  of respiratory  c/o's or need for noct saba. Also denies any obvious fluctuation of symptoms with weather or environmental changes or other aggravating or alleviating factors except as outlined above   Current Medications, Allergies, Complete Past Medical History, Past Surgical History, Family History, and Social History were reviewed in [employer] record.  ROS  The following are not active complaints unless bolded sore throat, dysphagia, dental problems, itching, sneezing,  nasal congestion or excess/ purulent secretions, ear ache,   fever, chills, sweats, unintended wt loss, pleuritic or exertional cp, hemoptysis,  orthopnea pnd or leg swelling, presyncope, palpitations, heartburn, abdominal pain, anorexia, nausea, vomiting, diarrhea  or change in bowel or urinary habits, change in stools or urine, dysuria,hematuria,  rash, arthralgias,  visual complaints, headache, numbness weakness or ataxia or problems with walking or coordination,  change in mood/affect or memory.             Past Medical History:  HYPERLIPIDEMIA (ICD-272.4)  ASTHMA (ICD-493.90)  HEALTH MAINTENANCE .............................. Lynnsie Linders - Pneumovax 02/2004 (second shot) and prevnar 13 11/24/2013  - Tdap 05/20/11 - CPX 05/28/2013  Ruptured achilles April 2010  DVT Left leg sp achilles surgery July 2010  - Repeat doppler 04/10/09: Extensive chronic L Leg DVT  -repeat doppler 10/11/09---resolution of DVT in CFV and SFV, residual clot in popliteal and peroneal veins       Objective:   Physical Exam    wt 209 March 14, 2008 > 216 May 15, 2009 >  > 222 November 07, 2009 > 221 05/20/2011 > 11/04/2012 196 > 05/28/2013  204 > 11/24/2013  206 > 09/13/2015  199   Ambulatory healthy appearing in no acute distress / vital signs reviewed BP noted  HEENT: nl dentition, and orophanx.  Neck without JVD/TM/ min rubbery node L ant cx not tender or warm. Lungs    without cough on insp or exp maneuvers / trace end exp wheeze bilaterally  RRR no s3 or murmur or increase in P2  Abd soft and benign with nl excursion in the supine position. No bruits or organomegaly  Ext   good pulses, neg Homan's Neuro alert, approp no deficits MS   Nl gait, no restrictions SKIN  Multiple nevi largest L post trunk still < pencil eraser, spherical and homogeneous with smooth borders, mild venous insuff  changes L Lower ext               Assessment & Plan:

## 2015-09-15 ENCOUNTER — Encounter: Payer: Self-pay | Admitting: Internal Medicine

## 2015-09-15 DIAGNOSIS — R590 Localized enlarged lymph nodes: Secondary | ICD-10-CM | POA: Insufficient documentation

## 2015-09-15 NOTE — Assessment & Plan Note (Signed)
S/p dental procedure L molar 07/2015  So this is almost certainly benign reactive adenopathy with no evidence of ca or ongoing dental concern > monitor and /fu prn

## 2015-09-15 NOTE — Assessment & Plan Note (Signed)
09/03/2010 >decreased symbicort 80/4.67mg  -  11/04/2012  > increased symbicort gto 160 bid as not consistent with use  -  hfa 90% p coaching 11/04/2012   Despite inconsistent adherence,  All goals of chronic asthma control met including optimal function and elimination of symptoms with minimal need for rescue therapy.  Contingencies discussed in full including contacting this office immediately if not controlling the symptoms using the rule of two's.

## 2015-12-18 ENCOUNTER — Encounter: Payer: Self-pay | Admitting: Internal Medicine

## 2015-12-18 ENCOUNTER — Ambulatory Visit (INDEPENDENT_AMBULATORY_CARE_PROVIDER_SITE_OTHER): Payer: BLUE CROSS/BLUE SHIELD | Admitting: Internal Medicine

## 2015-12-18 VITALS — BP 140/84 | HR 71 | Ht 72.0 in | Wt 207.6 lb

## 2015-12-18 DIAGNOSIS — R599 Enlarged lymph nodes, unspecified: Secondary | ICD-10-CM

## 2015-12-18 DIAGNOSIS — I10 Essential (primary) hypertension: Secondary | ICD-10-CM | POA: Diagnosis not present

## 2015-12-18 DIAGNOSIS — J4540 Moderate persistent asthma, uncomplicated: Secondary | ICD-10-CM | POA: Diagnosis not present

## 2015-12-18 DIAGNOSIS — R590 Localized enlarged lymph nodes: Secondary | ICD-10-CM

## 2015-12-18 MED ORDER — BUDESONIDE-FORMOTEROL FUMARATE 160-4.5 MCG/ACT IN AERO: INHALATION_SPRAY | RESPIRATORY_TRACT | 11 refills | Status: DC

## 2015-12-18 NOTE — Patient Instructions (Addendum)
Plan A = Automatic = Symbicort 160 Take 2 puffs first thing in am and then another 2 puffs about 12 hours later.    Plan B = Backup Only use your albuterol as a rescue medication to be used if you can't catch your breath by resting or doing a relaxed purse lip breathing pattern.  - The less you use it, the better it will work when you need it. - Ok to use the inhaler up to 2 puffs  every 4 hours if you must but call for appointment if use goes up over your usual need - Don't leave home without it !!  (think of it like the spare tire for your car)     CPX due 04/2016

## 2015-12-18 NOTE — Progress Notes (Signed)
Subjective:    Patient ID: Jose Crane, male    DOB: 1965/08/06    MRN: [id number]    Brief patient profile:  12 yowm never smoker with a history of childhood asthma & status asthmaticus requiring mechanical ventilation 4/97 at [facility].  He typically uses Symbicort, more on an as-needed basis rather than every day. He has only needed albuterol  rarely   History of Present Illness  09/13/2015  f/u ov/Jose Crane re:  Chronic asthma on symbicort 160 2 each am/very rare saba  Chief Complaint  Patient presents with  . Follow-up    Pt c/o "sensation" on the left side of his neck x 2 months.   area on neck is actually just under the mandible where he had recent dental surgery and denies ongoing dental concerns, pain, fever or chewing issues. No assoc ear ache or sinus problems  rec No change in recs   12/18/2015  f/u ov/Jose Crane re:  Chronic asthma/ no flares since last ov  Chief Complaint  Patient presents with  . Follow-up    Pt states still having discomfort in neck on the left side. No new co's today.   sometimes does not have the neck discomfort and never wakes at night with it Present daily though since tooth work  avg symbicort 2 /4   Maybe saba once a week / ex tol good / sleeping ok  Not limited by breathing from desired activities      No obvious day to day or daytime variabilty or assoc chronic cough or cp or chest tightness, subjective wheeze overt sinus or hb symptoms. No unusual exp hx or h/o childhood pna or knowledge of premature birth.  Sleeping ok without nocturnal  or early am exacerbation  of respiratory  c/o's or need for noct saba. Also denies any obvious fluctuation of symptoms with weather or environmental changes or other aggravating or alleviating factors except as outlined above   Current Medications, Allergies, Complete Past Medical History, Past Surgical History, Family History, and Social History were reviewed in [employer] record.  ROS  The  following are not active complaints unless bolded sore throat, dysphagia, dental problems, itching, sneezing,  nasal congestion or excess/ purulent secretions, ear ache,   fever, chills, sweats, unintended wt loss, pleuritic or exertional cp, hemoptysis,  orthopnea pnd or leg swelling, presyncope, palpitations, heartburn, abdominal pain, anorexia, nausea, vomiting, diarrhea  or change in bowel or urinary habits, change in stools or urine, dysuria,hematuria,  rash, arthralgias, visual complaints, headache, numbness weakness or ataxia or problems with walking or coordination,  change in mood/affect or memory.             Past Medical History:  HYPERLIPIDEMIA (ICD-272.4)  ASTHMA (ICD-493.90)  HEALTH MAINTENANCE .............................. Jose Crane - Pneumovax 02/2004 (second shot) and prevnar 13 11/24/2013  - Tdap 05/20/11 - CPX 05/28/2013  Ruptured achilles April 2010  DVT Left leg sp achilles surgery July 2010  - Repeat doppler 04/10/09: Extensive chronic L Leg DVT  -repeat doppler 10/11/09---resolution of DVT in CFV and SFV, residual clot in popliteal and peroneal veins       Objective:   Physical Exam    wt 209 March 14, 2008 > 216 May 15, 2009 >  > 222 November 07, 2009 > 221 05/20/2011 > 11/04/2012 196 > 05/28/2013  204 > 11/24/2013  206 > 09/13/2015  199 > 12/18/2015  208   Ambulatory healthy appearing in no acute distress / vital signs reviewed  HEENT: nl dentition, and orophanx.  Neck without JVD/TM/ no significant tenderness or palpable adenopathy L ant cx - neck not tender or warm with from  Lungs    without cough on insp or exp maneuvers / trace end exp wheeze bilaterally worse with FVC  RRR no s3 or murmur or increase in P2  Abd soft and benign with nl excursion in the supine position. No bruits or organomegaly  Ext   good pulses, neg Homan's Neuro alert, approp no deficits MS   Nl gait, no restrictions SKIN  Multiple nevi largest L post trunk still < pencil eraser,  spherical and homogeneous with smooth borders, mild venous insuff changes L Lower ext / ecchymosis L post thigh (pulled hamstring 5 d prior to [place]:

## 2015-12-18 NOTE — Assessment & Plan Note (Signed)
Marginally adequate control on present rx, reviewed > no change in rx needed

## 2015-12-18 NOTE — Assessment & Plan Note (Signed)
S/p dental procedure L molar 07/2015   No palpable node today > f/u ent prn

## 2015-12-18 NOTE — Assessment & Plan Note (Signed)
09/03/2010 >decreased symbicort 80/4.55mg  -  11/04/2012  > increased symbicort to 160 bid as not consistent with use  - hfa 90% p coaching 11/04/2012   Despite non-adherence documented/ discussed again today All goals of chronic asthma control met including optimal function and elimination of symptoms with minimal need for rescue therapy.  Contingencies discussed in full including contacting this office immediately if not controlling the symptoms using the rule of two's.     I had an extended discussion with the patient reviewing all relevant studies completed to date and  lasting 15 to 20 minutes of a 25 minute visit    Each maintenance medication was reviewed in detail including most importantly the difference between maintenance and prns and under what circumstances the prns are to be triggered using an action plan format that is not reflected in the computer generated alphabetically organized AVS.    Please see instructions for details which were reviewed in writing and the patient given a copy highlighting the part that I personally wrote and discussed at today's ov.

## 2016-01-26 ENCOUNTER — Other Ambulatory Visit: Payer: Self-pay | Admitting: Internal Medicine

## 2016-01-26 ENCOUNTER — Telehealth: Payer: Self-pay | Admitting: Internal Medicine

## 2016-01-26 ENCOUNTER — Ambulatory Visit (HOSPITAL_COMMUNITY)
Admission: RE | Admit: 2016-01-26 | Discharge: 2016-01-26 | Disposition: A | Discharge status: Home / Self Care | Payer: BLUE CROSS/BLUE SHIELD | Source: Ambulatory Visit | Attending: Internal Medicine | Admitting: Internal Medicine

## 2016-01-26 DIAGNOSIS — I82402 Acute embolism and thrombosis of unspecified deep veins of left lower extremity: Secondary | ICD-10-CM | POA: Diagnosis present

## 2016-01-26 DIAGNOSIS — I82812 Embolism and thrombosis of superficial veins of left lower extremities: Secondary | ICD-10-CM | POA: Diagnosis not present

## 2016-01-26 NOTE — Telephone Encounter (Signed)
Per 12/18/15 ov: Plan A = Automatic = Symbicort 160 Take 2 puffs first thing in am and then another 2 puffs about 12 hours later.   Plan B = Backup Only use your albuterol as a rescue medication to be used if you can't catch your breath by resting or doing a relaxed purse lip breathing pattern.  - The less you use it, the better it will work when you need it. - Ok to use the inhaler up to 2 puffs  every 4 hours if you must but call for appointment if use goes up over your usual need - Don't leave home without it !!  (think of it like the spare tire for your car)  CPX due 04/2016  ------  Pt c/o pain on the inside of left leg. Pain x few days. He reports he does have a h/o blood clots in the same leg. Requesting dopplers to be done ASAP. Please advise MW thanks

## 2016-01-26 NOTE — Telephone Encounter (Signed)
Fine with me but if can't get this scheduled today as out pt he is free to go to ER but should do this before 3 pm if possible so they don't have to call someone back in

## 2016-01-26 NOTE — Telephone Encounter (Signed)
Stat doppler of L le ordered.  Pt aware of recs.  Nothing further needed.

## 2016-01-26 NOTE — Telephone Encounter (Signed)
LMOMTCB x 1 

## 2016-01-26 NOTE — Progress Notes (Signed)
VASCULAR LAB PRELIMINARY  PRELIMINARY  PRELIMINARY  PRELIMINARY  Left lower extremity venous duplex completed.    Preliminary report:  Left - No evidence of deep vein thrombosis or Baker's cyst. Positive for a superficial thrombus to the greater saphenous vein coursing from tree inches above the ankle to three inches below the knee. There are also several thrombosed varicosities in the lower leg.  Godric Lavell, [place], RVS 01/26/2016, 5:49 PM

## 2016-01-26 NOTE — Telephone Encounter (Signed)
Pt returning call.Jose Crane ° °

## 2016-02-01 ENCOUNTER — Ambulatory Visit (INDEPENDENT_AMBULATORY_CARE_PROVIDER_SITE_OTHER): Payer: BLUE CROSS/BLUE SHIELD | Admitting: Internal Medicine

## 2016-02-01 ENCOUNTER — Encounter: Payer: Self-pay | Admitting: Internal Medicine

## 2016-02-01 VITALS — BP 152/90 | HR 99 | Ht 72.0 in | Wt 210.0 lb

## 2016-02-01 DIAGNOSIS — I82402 Acute embolism and thrombosis of unspecified deep veins of left lower extremity: Secondary | ICD-10-CM

## 2016-02-01 DIAGNOSIS — J4540 Moderate persistent asthma, uncomplicated: Secondary | ICD-10-CM | POA: Diagnosis not present

## 2016-02-01 DIAGNOSIS — I10 Essential (primary) hypertension: Secondary | ICD-10-CM

## 2016-02-01 MED ORDER — PREDNISONE 10 MG PO TABS: ORAL_TABLET | ORAL | 0 refills | Status: DC

## 2016-02-01 NOTE — Assessment & Plan Note (Addendum)
Note with nsaids slt up so not a good longterm  rx ? Need to change to sulindac? For now avoid salt/ monitor

## 2016-02-01 NOTE — Assessment & Plan Note (Signed)
09/03/2010 >decreased symbicort 80/4.57mcg  -  11/04/2012  > increased symbicort to 160 bid as not consistent with use  - hfa 90% p coaching 11/04/2012   Mild flare with weather changes > rx  Prednisone 10 mg take  4 each am x 2 days,   2 each am x 2 days,  1 each am x 2 days and stop

## 2016-02-01 NOTE — Progress Notes (Signed)
Subjective:    Patient ID: Jose Crane, male    DOB: 1965/12/18    MRN: [id number]    Brief patient profile:  77 yowm never smoker with a history of childhood asthma & status asthmaticus requiring mechanical ventilation 4/97 at [facility].  He typically uses Symbicort, more on an as-needed basis rather than every day. He has only needed albuterol  rarely   History of Present Illness  09/13/2015  f/u ov/[name] re:  Chronic asthma on symbicort 160 2 each am/very rare saba  Chief Complaint  Patient presents with  . Follow-up    Pt c/o "sensation" on the left side of his neck x 2 months.   area on neck is actually just under the mandible where he had recent dental surgery and denies ongoing dental concerns, pain, fever or chewing issues. No assoc ear ache or sinus problems  rec No change in recs   12/18/2015  f/u ov/[name] re:  Chronic asthma/ no flares since last ov  Chief Complaint  Patient presents with  . Follow-up    Pt states still having discomfort in neck on the left side. No new co's today.   sometimes does not have the neck discomfort and never wakes at night with it Present daily though since tooth work  avg symbicort 2 /4   Maybe saba once a week / ex tol good / sleeping ok  Not limited by breathing from desired activities   rec Plan A = Automatic = Symbicort 160 Take 2 puffs first thing in am and then another 2 puffs about 12 hours later.  Plan B = Backup Only use your albuterol as a rescue medication   CPX due 04/2016    PC 01/26/16 new L calf pain > venous doppler Pos sub phlebitis   02/01/2016 acute extended ov/[name] re: recurrent L sup phebitis  Chief Complaint  Patient presents with  . Acute Visit    Here to discuss venous u/s results. He is still having left leg pain, but this has improved some.    some better p aleve/ elevation/ heat  Breathing some worse with cough / congestion no purulent or bloody sputum       No obvious day to day or daytime variabilty or  assoc   cp or chest tightness, subjective wheeze overt sinus or hb symptoms. No unusual exp hx or h/o childhood pna or knowledge of premature birth.  Sleeping ok without nocturnal  or early am exacerbation  of respiratory  c/o's or need for noct saba. Also denies any obvious fluctuation of symptoms with weather or environmental changes or other aggravating or alleviating factors except as outlined above   Current Medications, Allergies, Complete Past Medical History, Past Surgical History, Family History, and Social History were reviewed in [employer] record.  ROS  The following are not active complaints unless bolded sore throat, dysphagia, dental problems, itching, sneezing,  nasal congestion or excess/ purulent secretions, ear ache,   fever, chills, sweats, unintended wt loss, pleuritic or exertional cp, hemoptysis,  orthopnea pnd or leg swelling, presyncope, palpitations, heartburn, abdominal pain, anorexia, nausea, vomiting, diarrhea  or change in bowel or urinary habits, change in stools or urine, dysuria,hematuria,  rash, arthralgias, visual complaints, headache, numbness weakness or ataxia or problems with walking or coordination,  change in mood/affect or memory.             Past Medical History:  HYPERLIPIDEMIA (ICD-272.4)  ASTHMA (ICD-493.90)  HEALTH MAINTENANCE .............................. [name] -  Pneumovax 02/2004 (second shot) and prevnar 13 11/24/2013  - Tdap 05/20/11 - CPX 05/28/2013  Ruptured achilles April 2010  DVT Left leg sp achilles surgery July 2010  - Repeat doppler 04/10/09: Extensive chronic L Leg DVT  - repeat doppler 10/11/09---resolution of DVT in CFV and SFV, residual clot in popliteal and peroneal veins       Objective:   Physical Exam    wt 209 March 14, 2008 > 216 May 15, 2009 >  > 222 November 07, 2009 > 221 05/20/2011 > 11/04/2012 196 > 05/28/2013  204 > 11/24/2013  206 > 09/13/2015  199 > 12/18/2015  208 > 02/01/2016  210   Ambulatory healthy appearing in no acute distress / vital signs reviewed  - note bp elevation    HEENT: nl dentition, and orophanx.  Neck without JVD/TM/ no significant tenderness or palpable adenopathy    Lungs    without cough on insp or exp maneuvers / mid exp wheeze bilaterally worse with FVC  RRR no s3 or murmur or increase in P2  Abd soft and benign with nl excursion in the supine position. No bruits or organomegaly  Ext   good pulses, neg Homan's Neuro alert, approp no deficits MS   Nl gait, no restrictions - knotty serpiginous L calf vericosities minimally tender/ neg homan's or edema SKIN  Multiple nevi            Assessment & Plan:

## 2016-02-01 NOTE — Patient Instructions (Addendum)
Please see patient coordinator before you leave today  to schedule evaluation by vascular surgery asap (w/in next week or two at most)   If breathing or leg get worse > Prednisone 10 mg take  4 each am x 2 days,   2 each am x 2 days,  1 each am x 2 days and stop   Schedule cpx before end of year

## 2016-02-01 NOTE — Assessment & Plan Note (Signed)
DVT Left leg after achilles surgery July 2010  - Repeat doppler 04/10/09: Extensive chronic L Leg DVT  -repeat doppler 10/11/09---resolution of DVT in CFV and SFV, residual clot in popliteal and peroneal veins  -repeat doppler 09/03/2010 >>residual clot in left proximal popliteal vein. >>refer to vascular  -OV w/ Dr. Kellie Simmering 09/25/10  in Vascular w/ rec of possible trial off coumadin>>pt decided to stop coumadin      >>>Per phone conversation with pt on  02/19/2011 >>pt did not follow up with Dr. Melvyn Novas  As recommended by Dr. Kellie Simmering to discuss            Coumadin , he stopped on 11/28/10 and does not want to be on couamdin, Is taking an ASA daily. [facility] notified- [name], RN. Pt advised of risks esp with immobility and travel. Pt verbalized understanding.  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 - Positive for a superficial thrombus of the greater saphenous vein coursing from about 3 inches above the ankle to three inches below the knee. 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 - Refer back to Dr Kellie Simmering  02/01/2016 >>>   ? May need to consider either back on coumadin or start NOAC but would like to get vascular surgeries opinion and in meantime continue heat, elevation/ nsaids  I had an extended discussion with the patient reviewing all relevant studies completed to date and  lasting 15 to 20 minutes of a 25 minute visit    Each maintenance medication was reviewed in detail including most importantly the difference between maintenance and prns and under what circumstances the prns are to be triggered using an action plan format that is not reflected in the computer generated alphabetically organized AVS.    Please see instructions for details which were reviewed in writing and the patient given a copy highlighting the  part that I personally wrote and discussed at today's ov.

## 2016-02-08 ENCOUNTER — Encounter: Payer: Self-pay | Admitting: Vascular Surgery

## 2016-02-12 ENCOUNTER — Encounter: Payer: Self-pay | Admitting: Vascular Surgery

## 2016-02-12 ENCOUNTER — Ambulatory Visit (INDEPENDENT_AMBULATORY_CARE_PROVIDER_SITE_OTHER): Payer: BLUE CROSS/BLUE SHIELD | Admitting: Vascular Surgery

## 2016-02-12 VITALS — BP 144/97 | HR 74 | Temp 97.1°F | Resp 18 | Ht 71.5 in | Wt 209.0 lb

## 2016-02-12 DIAGNOSIS — I83893 Varicose veins of bilateral lower extremities with other complications: Secondary | ICD-10-CM | POA: Insufficient documentation

## 2016-02-12 DIAGNOSIS — I83892 Varicose veins of left lower extremities with other complications: Secondary | ICD-10-CM | POA: Diagnosis not present

## 2016-02-12 NOTE — Progress Notes (Addendum)
Subjective:     Patient ID: Jose Crane, male   DOB: 1966/02/15, 50 y.o.   MRN: [id number]  HPI This 50 year old male is known to me from previous evaluation in 2012. Patient developed a left leg DVT following Achilles tendon surgery and was treated with Coumadin for 2 years beginning in 2010. That was discontinued and he has had no further DVT since that time. He was taken daily aspirin 325 mg. Several weeks ago he developed some acute thrombophlebitis in his left leg which was documented by a venous ultrasound which revealed no evidence of a new DVT or chronic DVT. He had thrombus and a distal varix the lower third of the left leg. He did have discomfort for a few weeks and that resolved. He denies any history of ulceration, bleeding, or DVT in the contralateral right leg. He does have aching throbbing and burning discomfort which worsens as the day progresses. He is concerned about the fact that he developed another blood clot.  Past Medical History:  Diagnosis Date  . Asthma   . DVT (deep venous thrombosis) ([place]) 10-2008   Left leg; after achilles surgery; Repeat doppler 04-10-2009: Extensive chronic L leg DVT. Repeat Doppler 10-11-2009--resolution of DVT in CFV and SFV, residual clot in popliteal and peroneal veins  . Hyperlipidemia   . Ruptured, tendon, Achilles 07-2008    Social History  Substance Use Topics  . Smoking status: Never Smoker  . Smokeless tobacco: Not on file  . Alcohol use 2.0 - 2.5 oz/week    4 - 5 drink(s) per week    Family History  Problem Relation Age of Onset  . Adopted: Yes    No Known Allergies   Current Outpatient Prescriptions:  .  acetaminophen (TYLENOL) 325 MG tablet, Take 650 mg by mouth every 6 (six) hours as needed.  , Disp: , Rfl:  .  budesonide-formoterol (SYMBICORT) 160-4.5 MCG/ACT inhaler, Take 2 puffs first thing in am and then another 2 puffs about 12 hours later., Disp: 1 Inhaler, Rfl: 11 .  naproxen sodium (ANAPROX) 220 MG tablet,  Take 220 mg by mouth 2 (two) times daily with a meal., Disp: , Rfl:  .  PROAIR HFA 108 (90 BASE) MCG/ACT inhaler, INHALE 2 PUFFS INTO THE LUNGS EVERY 6 HOURS AS NEEDED FOR WHEEZING OR SHORTNESS OF BREATH, Disp: 8.5 Inhaler, Rfl: 2 .  aspirin 325 MG tablet, Take 325 mg by mouth daily., Disp: , Rfl:  .  predniSONE (DELTASONE) 10 MG tablet, Take  4 each am x 2 days,   2 each am x 2 days,  1 each am x 2 days and stop (Patient not taking: Reported on 02/12/2016), Disp: 14 tablet, Rfl: 0  Vitals:   02/12/16 1019  BP: (!) 144/97  Pulse: 74  Resp: 18  Temp: 97.1 F (36.2 C)  SpO2: 95%  Weight: 209 lb (94.8 kg)  Height: 5' 11.5" (1.816 m)    Body mass index is 28.74 kg/m.         Review of Systems Denies chest pain, dyspnea on exertion, PND, orthopnea, hemoptysis. Does have a history of asthma. All other systems negative and complete review of systems    Objective:   Physical Exam BP (!) 144/97 (BP Location: Left Arm, Patient Position: Sitting, Cuff Size: Normal)   Pulse 74   Temp 97.1 F (36.2 C)   Resp 18   Ht 5' 11.5" (1.816 m)   Wt 209 lb (94.8 kg)   SpO2 95%  BMI 28.74 kg/m     Gen.-alert and oriented x3 in no apparent distress HEENT normal for age Lungs no rhonchi or wheezing Cardiovascular regular rhythm no murmurs carotid pulses 3+ palpable no bruits audible Abdomen soft nontender no palpable masses Musculoskeletal free of  major deformities Skin clear -no rashes Neurologic normal Lower extremities 3+ femoral and dorsalis pedis pulses palpable bilaterally with no edema on the right 1+ edema on the left. Early hyperpigmentation lower third left leg with no active ulcer. Large bulging varicosities lower third of left leg from the midcalf to the medial malleolus. Smaller varicosities in the right leg in the mid calf distally.  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 Patient experiencing symptoms which are affecting daily living. History of DVT left leg in 2010 with no recurrence and evidence of no residual DVT on recent ultrasound exam performed 01/28/2016 Asthma History of bilateral Achilles tendon repairs    Plan:         #1 long leg elastic compression stockings 20-30 mm gradient #2 elevate legs as much as possible #3 ibuprofen daily on a regular basis for pain #4 return in 3 months-we will obtain formal reflux venous exam left leg prior to patient's return to confirm gross reflux and large caliber left great saphenous vein in the absence of DVT in the left leg He will likely need laser ablation left great saphenous vein plus multiple stab phlebectomy of painful varicosities but we'll make formal recommendation when he returns in 3 months I have also recommended the patient resume daily aspirin therapy which he discontinued recently when he had the superficial thrombophlebitis and exchanged this for Aleve

## 2016-02-23 ENCOUNTER — Encounter (HOSPITAL_COMMUNITY): Payer: BLUE CROSS/BLUE SHIELD

## 2016-02-23 ENCOUNTER — Encounter: Payer: BLUE CROSS/BLUE SHIELD | Admitting: Vascular Surgery

## 2016-02-27 ENCOUNTER — Encounter: Payer: BLUE CROSS/BLUE SHIELD | Admitting: Vascular Surgery

## 2016-04-05 ENCOUNTER — Telehealth: Payer: Self-pay | Admitting: *Deleted

## 2016-04-05 NOTE — Telephone Encounter (Signed)
Jose Crane calling with questions about traveling (car trip to [place] and air travel to [place]) after 05-14-2016 3 month follow up with Dr. Kellie Simmering and venous reflux exam.  Jose Crane has history of left leg DVT in 2010 and history of left leg superficial thrombophlebitis noted in venous duplex on 01-26-2016 and with office note from 02-12-2016 per Dr. Kellie Simmering.  Jose Crane states currently no left leg, foot or ankle pain or swelling and he takes Aspirin 325 mg. daily.  Explained to him that 05-14-2016 appointment was only  to have venous reflux study done and to see Dr. Kellie Simmering for 3 month follow up--that office surgery would not occur until after venous reflux documentation and visit with Dr. Kellie Simmering and pre-certification process with his insurance carrier.  Advised him that results of venous reflux exam would determine/affect his travel plans and Dr. Kellie Simmering would advise him after venous reflux if there were any contraindications with travel.

## 2016-05-09 ENCOUNTER — Encounter: Payer: Self-pay | Admitting: Vascular Surgery

## 2016-05-10 ENCOUNTER — Other Ambulatory Visit: Payer: Self-pay | Admitting: *Deleted

## 2016-05-10 DIAGNOSIS — I83892 Varicose veins of left lower extremities with other complications: Secondary | ICD-10-CM

## 2016-05-14 ENCOUNTER — Ambulatory Visit (HOSPITAL_COMMUNITY)
Admission: RE | Admit: 2016-05-14 | Discharge: 2016-05-14 | Disposition: A | Discharge status: Home / Self Care | Payer: BLUE CROSS/BLUE SHIELD | Source: Ambulatory Visit | Attending: Vascular Surgery | Admitting: Vascular Surgery

## 2016-05-14 ENCOUNTER — Encounter: Payer: Self-pay | Admitting: Vascular Surgery

## 2016-05-14 ENCOUNTER — Ambulatory Visit (INDEPENDENT_AMBULATORY_CARE_PROVIDER_SITE_OTHER): Payer: BLUE CROSS/BLUE SHIELD | Admitting: Vascular Surgery

## 2016-05-14 VITALS — BP 147/97 | HR 72 | Temp 98.2°F | Resp 18 | Ht 72.0 in | Wt 208.0 lb

## 2016-05-14 DIAGNOSIS — I83893 Varicose veins of bilateral lower extremities with other complications: Secondary | ICD-10-CM

## 2016-05-14 DIAGNOSIS — I83892 Varicose veins of left lower extremities with other complications: Secondary | ICD-10-CM | POA: Insufficient documentation

## 2016-05-14 NOTE — Progress Notes (Signed)
Subjective:     Patient ID: Jose Crane, male   DOB: 14-Feb-1966, 51 y.o.   MRN: [id number]  HPI This 51 year old male returns for continued follow-up regarding his bilateral lower extremity varicose veins. He has a history of superficial thrombophlebitis and a remote history of DVT in the left leg. He also has painful varicosities in the right leg particularly below the knee with some darkening of the skin. He has no history of DVT in the right leg. He has been wearing long leg elastic compression stockings and trying ibuprofen and elevation with no improvement in the symptomatology of his right leg. His left leg has mild edema as the day progresses.  Past Medical History:  Diagnosis Date  . Asthma   . DVT (deep venous thrombosis) ([place]) 10-2008   Left leg; after achilles surgery; Repeat doppler 04-10-2009: Extensive chronic L leg DVT. Repeat Doppler 10-11-2009--resolution of DVT in CFV and SFV, residual clot in popliteal and peroneal veins  . Hyperlipidemia   . Ruptured, tendon, Achilles 07-2008    Social History  Substance Use Topics  . Smoking status: Never Smoker  . Smokeless tobacco: Never Used  . Alcohol use 2.0 - 2.5 oz/week    4 - 5 Standard drinks or equivalent per week    Family History  Problem Relation Age of Onset  . Adopted: Yes    No Known Allergies   Current Outpatient Prescriptions:  .  acetaminophen (TYLENOL) 325 MG tablet, Take 650 mg by mouth every 6 (six) hours as needed.  , Disp: , Rfl:  .  aspirin 325 MG tablet, Take 325 mg by mouth daily., Disp: , Rfl:  .  budesonide-formoterol (SYMBICORT) 160-4.5 MCG/ACT inhaler, Take 2 puffs first thing in am and then another 2 puffs about 12 hours later., Disp: 1 Inhaler, Rfl: 11 .  naproxen sodium (ANAPROX) 220 MG tablet, Take 220 mg by mouth 2 (two) times daily with a meal., Disp: , Rfl:  .  predniSONE (DELTASONE) 10 MG tablet, Take  4 each am x 2 days,   2 each am x 2 days,  1 each am x 2 days and stop, Disp: 14  tablet, Rfl: 0 .  PROAIR HFA 108 (90 BASE) MCG/ACT inhaler, INHALE 2 PUFFS INTO THE LUNGS EVERY 6 HOURS AS NEEDED FOR WHEEZING OR SHORTNESS OF BREATH, Disp: 8.5 Inhaler, Rfl: 2  Vitals:   05/14/16 1346  BP: (!) 147/97  Pulse: 72  Resp: 18  Temp: 98.2 F (36.8 C)  Weight: 208 lb (94.3 kg)  Height: 6' (1.829 m)    Body mass index is 28.21 kg/m.         Review of Systems Chest pain, dyspnea on exertion, PND, orthopnea, hemoptysis, claudication    Objective:   Physical Exam BP (!) 147/97 (BP Location: Left Arm, Patient Position: Sitting, Cuff Size: Normal)   Pulse 72   Temp 98.2 F (36.8 C)   Resp 18   Ht 6' (1.829 m)   Wt 208 lb (94.3 kg)   BMI 28.21 kg/m   Gen. well-developed well-nourished male no apparent distress alert and oriented 3  Lungs no rhonchi or wheezing Right leg with large bulging varicosities beginning in the mid calf extending to the medial malleolus with early hyperpigmentation and reticular veins around malleolar area. Trace edema noted. Left leg with 2 prominent varicosities between the knee and the medial malleolus which do not appear to be thrombosed by palpation. 3+ dorsalis pedis pulse palpable bilaterally.  Today a  venous duplex exam of the left leg was ordered which I reviewed and interpreted. There is some evidence of some chronic DVT in the left femoral and popliteal veins which is not obstructive in nature. There is no reflux in the left great saphenous system  Today I performed a bedside SonoSite ultrasound exam of the right leg which reveals an enlarged great saphenous vein with gross reflux supplying these painful varicosities below the knee which are symptomatic     Assessment:     . Stable left lower extremity with 2 prominent varicosities in the mid calf but no evidence of gross reflux left great saphenous vein and chronic nonobstructive DVT in the left popliteal and femoral veins Gross reflux right great saphenous vein with  enlarged vein supplying painful varicosities below the knee-affecting patient's daily living and symptomatic-these have not responded to conservative measures including long-leg elastic compression stockings 20-30 millimeter gradient, elevation, and ibuprofen    Plan:     #1 we'll obtain formal venous duplex exam of right leg to determine if this agrees with my bedside ultrasound or #2 if so will proceed with precertification for laser ablation right great saphenous vein +10-20 stab phlebectomy of painful varicosities right leg Not recommend any treatment of the superficial venous system in the left leg since patient has some chronic DVT and since there is no demonstrable reflux in the left great saphenous system at this time despite 2 varicosities in the left medial calf

## 2016-05-15 ENCOUNTER — Encounter (HOSPITAL_COMMUNITY): Payer: BLUE CROSS/BLUE SHIELD

## 2016-05-21 ENCOUNTER — Ambulatory Visit: Payer: BLUE CROSS/BLUE SHIELD | Admitting: Vascular Surgery

## 2016-05-21 ENCOUNTER — Encounter (HOSPITAL_COMMUNITY): Payer: BLUE CROSS/BLUE SHIELD

## 2016-07-20 ENCOUNTER — Other Ambulatory Visit: Payer: Self-pay | Admitting: Internal Medicine

## 2016-11-29 ENCOUNTER — Other Ambulatory Visit: Payer: Self-pay | Admitting: Internal Medicine

## 2016-12-10 ENCOUNTER — Telehealth: Payer: Self-pay | Admitting: Internal Medicine

## 2016-12-10 NOTE — Telephone Encounter (Signed)
Pt is aware that I have left coupon card at front for pick up on 12/11/16 and aware to bring ID to pick item(s) up. Nothing more needed at this time.

## 2016-12-12 ENCOUNTER — Telehealth: Payer: Self-pay | Admitting: Internal Medicine

## 2016-12-12 NOTE — Telephone Encounter (Signed)
Spoke with pt. He is needing clarification on how the Symbicort savings card works. I have explained the process to him. Nothing further was needed.

## 2016-12-13 ENCOUNTER — Telehealth: Payer: Self-pay | Admitting: Internal Medicine

## 2016-12-13 MED ORDER — BUDESONIDE-FORMOTEROL FUMARATE 160-4.5 MCG/ACT IN AERO: 2.0000 | INHALATION_SPRAY | Freq: Two times a day (BID) | RESPIRATORY_TRACT | 1 refills | Status: DC

## 2016-12-13 MED ORDER — BUDESONIDE-FORMOTEROL FUMARATE 160-4.5 MCG/ACT IN AERO: 2.0000 | INHALATION_SPRAY | Freq: Two times a day (BID) | RESPIRATORY_TRACT | 3 refills | Status: DC

## 2016-12-13 MED ORDER — ALBUTEROL SULFATE HFA 108 (90 BASE) MCG/ACT IN AERS: INHALATION_SPRAY | RESPIRATORY_TRACT | 1 refills | Status: DC

## 2016-12-13 NOTE — Telephone Encounter (Signed)
Called and spoke with pt and he stated that his insurance is making him do the 3 month supply through [facility].  He is aware of the refill that has been sent in and nothing further is needed.

## 2016-12-13 NOTE — Telephone Encounter (Signed)
Called and spoke with pt. Pt request Rx for Symbicort 160 and proair to be sent to [facility], as he is switching pharmacies. Rx has been sent to preferred pharmacy. Nothing further needed.

## 2017-06-17 ENCOUNTER — Telehealth: Payer: Self-pay | Admitting: Internal Medicine

## 2017-06-17 NOTE — Telephone Encounter (Signed)
Spoke with pt, advised I would leave coupon up front to pick up. Nothing further is needed.

## 2017-10-08 ENCOUNTER — Other Ambulatory Visit: Payer: Self-pay | Admitting: Internal Medicine

## 2017-11-14 ENCOUNTER — Encounter: Payer: Self-pay | Admitting: Gastroenterology

## 2017-11-14 ENCOUNTER — Encounter: Payer: Self-pay | Admitting: Internal Medicine

## 2017-11-14 ENCOUNTER — Ambulatory Visit (INDEPENDENT_AMBULATORY_CARE_PROVIDER_SITE_OTHER): Payer: BLUE CROSS/BLUE SHIELD | Admitting: Internal Medicine

## 2017-11-14 VITALS — BP 176/104 | HR 77 | Ht 72.0 in | Wt 210.0 lb

## 2017-11-14 DIAGNOSIS — I10 Essential (primary) hypertension: Secondary | ICD-10-CM

## 2017-11-14 DIAGNOSIS — J4540 Moderate persistent asthma, uncomplicated: Secondary | ICD-10-CM | POA: Diagnosis not present

## 2017-11-14 DIAGNOSIS — Z0000 Encounter for general adult medical examination without abnormal findings: Secondary | ICD-10-CM | POA: Diagnosis not present

## 2017-11-14 DIAGNOSIS — I82402 Acute embolism and thrombosis of unspecified deep veins of left lower extremity: Secondary | ICD-10-CM

## 2017-11-14 MED ORDER — BISOPROLOL FUMARATE 5 MG PO TABS: 5.0000 mg | ORAL_TABLET | Freq: Every day | ORAL | 11 refills | Status: DC

## 2017-11-14 NOTE — Progress Notes (Signed)
Subjective:    Patient ID: Jose Crane, male    DOB: [birth year]    MRN: [id number]    Brief patient profile:  10   yowm never smoker with a history of childhood asthma & status asthmaticus requiring mechanical ventilation 4/97 at [facility].  He typically uses Symbicort, more on an as-needed basis rather than every day. He has only needed albuterol  rarely    History of Present Illness  09/13/2015  f/u ov/Lessie Crane re:  Chronic asthma on symbicort 160 2 each am/very rare saba  Chief Complaint  Patient presents with  . Follow-up    Pt c/o "sensation" on the left side of his neck x 2 months.   area on neck is actually just under the mandible where he had recent dental surgery and denies ongoing dental concerns, pain, fever or chewing issues. No assoc ear ache or sinus problems  rec No change in recs   12/18/2015  f/u ov/Jose Crane re:  Chronic asthma/ no flares since last ov  Chief Complaint  Patient presents with  . Follow-up    Pt states still having discomfort in neck on the left side. No new co's today.   sometimes does not have the neck discomfort and never wakes at night with it Present daily though since tooth work  avg symbicort 2 /4   Maybe saba once a week / ex tol good / sleeping ok  Not limited by breathing from desired activities   rec Plan A = Automatic = Symbicort 160 Take 2 puffs first thing in am and then another 2 puffs about 12 hours later.  Plan B = Backup Only use your albuterol as a rescue medication   CPX due 04/2016    PC 01/26/16 new L calf pain > venous doppler Pos sub phlebitis   02/01/2016 acute extended ov/Jose Crane re: recurrent L sup phebitis  Chief Complaint  Patient presents with  . Acute Visit    Here to discuss venous u/s results. He is still having left leg pain, but this has improved some.    some better p aleve/ elevation/ heat  Breathing some worse with cough / congestion no purulent or bloody sputum  rec Please see patient coordinator before you leave  today  to schedule evaluation by vascular surgery asap (w/in next week or two at most) If breathing or leg get worse > Prednisone 10 mg take  4 each am x 2 days,   2 each am x 2 days,  1 each am x 2 days and stop  Schedule cpx before end of year > did not do    11/14/2017  f/u ov/Jose Crane re: re-establish re  chronic asthma/ chronic dvt On L / hbp / non-adherence  Chief Complaint  Patient presents with  . Follow-up    Appetite not doing as well recently. His BP is elevated today.   Dyspnea:  Not limited by breathing from desired activities   Cough: no  SABA use: once a month 02: none    No obvious day to day or daytime variability or assoc excess/ purulent sputum or mucus plugs or hemoptysis or cp or chest tightness, subjective wheeze or overt sinus or hb symptoms.    Sleeping: ok flat/ one pillow  without nocturnal  or early am exacerbation  of respiratory  c/o's or need for noct saba. Also denies any obvious fluctuation of symptoms with weather or environmental changes or other aggravating or alleviating factors except as outlined above   No  unusual exposure hx or h/o childhood pna/ asthma or knowledge of premature birth.  Current Allergies, Complete Past Medical History, Past Surgical History, Family History, and Social History were reviewed in [employer] record.  ROS  The following are not active complaints unless bolded Hoarseness, sore throat, dysphagia, dental problems, itching, sneezing,  nasal congestion or discharge of excess mucus or purulent secretions, ear ache,   fever, chills, sweats, unintended wt loss or wt gain, classically pleuritic or exertional cp,  orthopnea pnd or arm/hand swelling  or leg swelling, presyncope, palpitations, abdominal pain, anorexia, nausea, vomiting, diarrhea  or change in bowel habits or change in bladder habits, change in stools or change in urine, dysuria, hematuria,  rash, arthralgias, visual complaints, headache, numbness,  weakness or ataxia or problems with walking or coordination,  change in mood or  memory.        Current Meds  Medication Sig  . albuterol (PROAIR HFA) 108 (90 Base) MCG/ACT inhaler INHALE 2 PUFFS INTO THE LUNGS EVERY 6 HOURS AS NEEDED FOR WHEEZING OR SHORTNESS OF BREATH  . aspirin 325 MG tablet Take 325 mg by mouth daily.  . SYMBICORT 160-4.5 MCG/ACT inhaler INHALE 2 PUFFS FIRST THING IN THE MORNING THEN ANOTHER 2 PUFFS ABOUT 12 HOURS LATER           Past Medical History:  HYPERLIPIDEMIA (ICD-272.4)  ASTHMA (ICD-493.90)  HEALTH MAINTENANCE .............................. Jose Crane - Pneumovax 02/2004 (second shot) and prevnar 13 11/24/2013  - Tdap 05/20/11 - CPX 05/28/2013  Ruptured achilles April 2010  DVT Left leg sp achilles surgery July 2010  - Repeat doppler 04/10/09: Extensive chronic L Leg DVT  - repeat doppler 10/11/09---resolution of DVT in CFV and SFV, residual clot in popliteal and peroneal veins       Objective:   Physical Exam    wt 209 March 14, 2008 > 216 May 15, 2009 >  > 222 November 07, 2009 > 221 05/20/2011 > 11/04/2012 196 > 05/28/2013  204 > 11/24/2013  206 > 09/13/2015  199 > 12/18/2015  208 > 02/01/2016  210 >  11/14/2017   100%  Vital signs reviewed - Note on arrival 02 sats  100% on RA   And bp 176/104 noted    HEENT: nl dentition, turbinates bilaterally, and oropharynx. Nl external ear canals without cough reflex   NECK :  without JVD/Nodes/TM/ nl carotid upstrokes bilaterally   LUNGS: no acc muscle use,  Nl contour chest with mid exp wheeze  bilaterally without cough on insp or exp maneuvers   CV:  RRR  no s3 or murmur or increase in P2, and no edema   ABD:  soft and nontender with nl inspiratory excursion in the supine position. No bruits or organomegaly appreciated, bowel sounds nl  MS:  Nl gait/ ext warm without deformities, calf tenderness, cyanosis or clubbing No obvious joint restrictions   SKIN: warm and dry without lesions    NEURO:  alert,  approp, nl sensorium with  no motor or cerebellar deficits apparent.           Assessment & Plan:

## 2017-11-14 NOTE — Patient Instructions (Addendum)
Bisoprolol 5 mg daily   Please see patient coordinator before you leave today  to schedule colonoscopy    Please schedule a follow up office visit in 6 weeks, call sooner if needed

## 2017-11-15 ENCOUNTER — Encounter: Payer: Self-pay | Admitting: Internal Medicine

## 2017-11-15 NOTE — Assessment & Plan Note (Signed)
-   Prevnar 13 given 11/24/13  - referred for colonoscopy 11/14/2017 and rec return for  cpx

## 2017-11-15 NOTE — Assessment & Plan Note (Signed)
Needs to start rx now:  In the setting of chronic asthma,  It would be preferable to use bystolic, the most beta -1  selective Beta blocker available in sample form, with bisoprolol the most selective generic choice  on the market, at least on a trial basis, to make sure the spillover Beta 2 effects of the less specific Beta blockers are not contributing to this patient's symptoms.   Try bisoprolol 5 mg daily > schedule cpx

## 2017-11-15 NOTE — Assessment & Plan Note (Signed)
DVT Left leg after achilles surgery July 2010  - Repeat doppler 04/10/09: Extensive chronic L Leg DVT  -repeat doppler 10/11/09---resolution of DVT in CFV and SFV, residual clot in popliteal and peroneal veins  -repeat doppler 09/03/2010 >>residual clot in left proximal popliteal vein. >>refer to vascular  -OV w/ Dr. Kellie Simmering 09/25/10  in Vascular w/ rec of possible trial off coumadin>>pt decided to stop coumadin      >>>Per phone conversation with pt on  02/19/2011 >>pt did not follow up with Dr. Melvyn Novas  As recommended by Dr. Kellie Simmering to discuss            Coumadin , he stopped on 11/28/10 and does not want to be on couamdin, Is taking an ASA daily. [facility] notified- [name], RN. Pt advised of risks esp with immobility and travel. Pt verbalized understanding.  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 - Positive for a superficial thrombus of the greater saphenous vein coursing from about 3 inches above the ankle to three inches below the knee. 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 - Refer back to Dr Kellie Simmering  02/01/2016 > no Change recs   rec continue asa 325 mg daily , f/u with vascular surgery prn

## 2017-11-15 NOTE — Assessment & Plan Note (Signed)
09/03/2010 >decreased symbicort 80/4.54mcg  -  11/04/2012  > increased symbicort to 160 bid as not consistent with use    - 11/14/2017  After extensive coaching inhaler device  effectiveness =    90% > rec continue symb 160 2bid   Consistently inconsistent with using meds as rec despite req mechanical vent previously but at least not over using saba at present so no change in rx for now    I had an extended discussion with the patient reviewing all relevant studies completed to date and  lasting 15 to 20 minutes of a 25 minute visit    See device teaching which extended face to face time for this visit.  Each maintenance medication was reviewed in detail including emphasizing most importantly the difference between maintenance and prns and under what circumstances the prns are to be triggered using an action plan format that is not reflected in the computer generated alphabetically organized AVS which I have not found useful in most complex patients, especially with respiratory illnesses  Please see AVS for specific instructions unique to this visit that I personally wrote and verbalized to the the pt in detail and then reviewed with pt  by my nurse highlighting any  changes in therapy recommended at today's visit to their plan of care.

## 2017-11-20 ENCOUNTER — Telehealth: Payer: Self-pay | Admitting: Internal Medicine

## 2017-11-20 NOTE — Telephone Encounter (Signed)
Called and spoke with Clair Gulling with [facility] at phone [phone] Clair Gulling states that pt's rx for Zebeta 5mg  is on back order for last week, and will be another 3 weeks or more. Is there an alternative medication patient can be on at this time.  MW please advise

## 2017-11-20 NOTE — Telephone Encounter (Signed)
Lopressor 50 mg bid x one month supply

## 2017-11-21 MED ORDER — METOPROLOL TARTRATE 50 MG PO TABS: 50.0000 mg | ORAL_TABLET | Freq: Two times a day (BID) | ORAL | 0 refills | Status: DC

## 2017-11-21 NOTE — Telephone Encounter (Signed)
Called and spoke with pt letting him know we were going to send a 1 month supply of lopressor to pharmacy since bisoprolol is on back order.  Pt expressed understanding. Script sent in to pt's pharmacy.  Called pt's pharmacy and spoke with Jenny Reichmann stating that once bisoprolol came in that pt was to start on that. Cindy expressed understanding. Nothing further needed.

## 2017-12-17 ENCOUNTER — Other Ambulatory Visit: Payer: Self-pay | Admitting: Internal Medicine

## 2017-12-18 ENCOUNTER — Other Ambulatory Visit: Payer: Self-pay

## 2017-12-18 ENCOUNTER — Ambulatory Visit (AMBULATORY_SURGERY_CENTER): Payer: Self-pay

## 2017-12-18 VITALS — Ht 71.0 in | Wt 205.0 lb

## 2017-12-18 DIAGNOSIS — Z1211 Encounter for screening for malignant neoplasm of colon: Secondary | ICD-10-CM

## 2017-12-18 MED ORDER — NA SULFATE-K SULFATE-MG SULF 17.5-3.13-1.6 GM/177ML PO SOLN: 1.0000 | Freq: Once | ORAL | 0 refills | Status: AC

## 2017-12-18 NOTE — Progress Notes (Signed)
No egg or soy allergy known to patient  No issues with past sedation with any surgeries  or procedures, no intubation problems  No diet pills per patient No home 02 use per patient  No blood thinners per patient  Pt denies issues with constipation  No A fib or A flutter  EMMI video sent to pt's e mail , pt declined    

## 2017-12-30 ENCOUNTER — Telehealth: Payer: Self-pay | Admitting: Internal Medicine

## 2017-12-30 NOTE — Telephone Encounter (Signed)
Called and spoke with patient he stated that he has an appointment on 9.9.84 with MW for his annual PCP visit. He is wanting to know if he needs to fast for blood work during this appointment.    MW please advise, thank you.

## 2017-12-30 NOTE — Telephone Encounter (Signed)
Pt is aware of below message and voiced his understanding. Nothing further is needed.  

## 2017-12-30 NOTE — Telephone Encounter (Signed)
Yes, fasting

## 2018-01-01 ENCOUNTER — Encounter: Payer: Self-pay | Admitting: Gastroenterology

## 2018-01-05 ENCOUNTER — Ambulatory Visit (INDEPENDENT_AMBULATORY_CARE_PROVIDER_SITE_OTHER)
Admission: RE | Admit: 2018-01-05 | Discharge: 2018-01-05 | Disposition: A | Discharge status: Home / Self Care | Payer: BLUE CROSS/BLUE SHIELD | Source: Ambulatory Visit | Attending: Internal Medicine | Admitting: Internal Medicine

## 2018-01-05 ENCOUNTER — Other Ambulatory Visit (INDEPENDENT_AMBULATORY_CARE_PROVIDER_SITE_OTHER): Payer: BLUE CROSS/BLUE SHIELD

## 2018-01-05 ENCOUNTER — Ambulatory Visit (INDEPENDENT_AMBULATORY_CARE_PROVIDER_SITE_OTHER): Payer: BLUE CROSS/BLUE SHIELD | Admitting: Internal Medicine

## 2018-01-05 ENCOUNTER — Encounter: Payer: Self-pay | Admitting: Internal Medicine

## 2018-01-05 VITALS — BP 126/84 | HR 53 | Ht 71.0 in | Wt 201.0 lb

## 2018-01-05 DIAGNOSIS — I10 Essential (primary) hypertension: Secondary | ICD-10-CM | POA: Diagnosis not present

## 2018-01-05 DIAGNOSIS — J4540 Moderate persistent asthma, uncomplicated: Secondary | ICD-10-CM

## 2018-01-05 DIAGNOSIS — Z0000 Encounter for general adult medical examination without abnormal findings: Secondary | ICD-10-CM | POA: Diagnosis not present

## 2018-01-05 LAB — CBC WITH DIFFERENTIAL/PLATELET
BASOS ABS: 0 10*3/uL (ref 0.0–0.1)
Basophils Relative: 0.6 % (ref 0.0–3.0)
Eosinophils Absolute: 0.5 10*3/uL (ref 0.0–0.7)
Eosinophils Relative: 8 % — ABNORMAL HIGH (ref 0.0–5.0)
HEMATOCRIT: 44.7 % (ref 39.0–52.0)
Hemoglobin: 15.6 g/dL (ref 13.0–17.0)
LYMPHS PCT: 28.6 % (ref 12.0–46.0)
Lymphs Abs: 1.9 10*3/uL (ref 0.7–4.0)
MCHC: 34.8 g/dL (ref 30.0–36.0)
MCV: 87 fl (ref 78.0–100.0)
MONOS PCT: 6.2 % (ref 3.0–12.0)
Monocytes Absolute: 0.4 10*3/uL (ref 0.1–1.0)
NEUTROS ABS: 3.7 10*3/uL (ref 1.4–7.7)
Neutrophils Relative %: 56.6 % (ref 43.0–77.0)
Platelets: 189 10*3/uL (ref 150.0–400.0)
RBC: 5.14 Mil/uL (ref 4.22–5.81)
RDW: 13.4 % (ref 11.5–15.5)
WBC: 6.6 10*3/uL (ref 4.0–10.5)

## 2018-01-05 LAB — URINALYSIS, ROUTINE W REFLEX MICROSCOPIC
Bilirubin Urine: NEGATIVE
Ketones, ur: NEGATIVE
Leukocytes, UA: NEGATIVE
Nitrite: NEGATIVE
SPECIFIC GRAVITY, URINE: 1.02 (ref 1.000–1.030)
Total Protein, Urine: NEGATIVE
Urine Glucose: NEGATIVE
Urobilinogen, UA: 0.2 (ref 0.0–1.0)
WBC UA: NONE SEEN (ref 0–?)
pH: 6 (ref 5.0–8.0)

## 2018-01-05 LAB — BASIC METABOLIC PANEL
BUN: 17 mg/dL (ref 6–23)
CHLORIDE: 103 meq/L (ref 96–112)
CO2: 28 mEq/L (ref 19–32)
CREATININE: 1.3 mg/dL (ref 0.40–1.50)
Calcium: 9.6 mg/dL (ref 8.4–10.5)
GFR: 62.08 mL/min (ref >60.00)
GLUCOSE: 95 mg/dL (ref 70–99)
Potassium: 4.2 mEq/L (ref 3.5–5.1)
Sodium: 136 mEq/L (ref 135–145)

## 2018-01-05 LAB — HEPATIC FUNCTION PANEL
ALT: 23 U/L (ref 0–53)
AST: 14 U/L (ref 0–37)
Albumin: 4.5 g/dL (ref 3.5–5.2)
Alkaline Phosphatase: 45 U/L (ref 39–117)
BILIRUBIN DIRECT: 0.1 mg/dL (ref 0.0–0.3)
TOTAL PROTEIN: 7 g/dL (ref 6.0–8.3)
Total Bilirubin: 0.6 mg/dL (ref 0.2–1.2)

## 2018-01-05 LAB — LIPID PANEL
CHOL/HDL RATIO: 4
Cholesterol: 204 mg/dL — ABNORMAL HIGH (ref 0–200)
HDL: 55.2 mg/dL (ref >39.00)
LDL Cholesterol: 126 mg/dL — ABNORMAL HIGH (ref 0–99)
NONHDL: 148.54
Triglycerides: 114 mg/dL (ref 0.0–149.0)
VLDL: 22.8 mg/dL (ref 0.0–40.0)

## 2018-01-05 LAB — PSA: PSA: 1.08 ng/mL (ref 0.10–4.00)

## 2018-01-05 LAB — TSH: TSH: 0.86 u[IU]/mL (ref 0.35–4.50)

## 2018-01-05 NOTE — Progress Notes (Signed)
Spoke with pt and notified of results per Dr. [name]. Pt verbalized understanding and denied any questions. 

## 2018-01-05 NOTE — Progress Notes (Signed)
Subjective:    Patient ID: Jose Crane, male    DOB: 10/12/1965    MRN: [id number]    Brief patient profile:  82   yowm never smoker with a history of childhood asthma & status asthmaticus requiring mechanical ventilation 4/97 at [facility].  He typically uses Symbicort, more on an as-needed basis rather than every day. He has only needed albuterol  rarely    History of Present Illness  09/13/2015  f/u ov/Jose Crane re:  Chronic asthma on symbicort 160 2 each am/very rare saba  Chief Complaint  Patient presents with  . Follow-up    Pt c/o "sensation" on the left side of his neck x 2 months.   area on neck is actually just under the mandible where he had recent dental surgery and denies ongoing dental concerns, pain, fever or chewing issues. No assoc ear ache or sinus problems  rec No change in recs   12/18/2015  f/u ov/Jose Crane re:  Chronic asthma/ no flares since last ov  Chief Complaint  Patient presents with  . Follow-up    Pt states still having discomfort in neck on the left side. No new co's today.   sometimes does not have the neck discomfort and never wakes at night with it Present daily though since tooth work  avg symbicort 2 /4   Maybe saba once a week / ex tol good / sleeping ok  Not limited by breathing from desired activities   rec Plan A = Automatic = Symbicort 160 Take 2 puffs first thing in am and then another 2 puffs about 12 hours later.  Plan B = Backup Only use your albuterol as a rescue medication   CPX due 04/2016    PC 01/26/16 new L calf pain > venous doppler Pos sub phlebitis   02/01/2016 acute extended ov/Jose Crane re: recurrent L sup phebitis  Chief Complaint  Patient presents with  . Acute Visit    Here to discuss venous u/s results. He is still having left leg pain, but this has improved some.    some better p aleve/ elevation/ heat  Breathing some worse with cough / congestion no purulent or bloody sputum  rec Please see patient coordinator before you leave  today  to schedule evaluation by vascular surgery asap (w/in next week or two at most) If breathing or leg get worse > Prednisone 10 mg take  4 each am x 2 days,   2 each am x 2 days,  1 each am x 2 days and stop  Schedule cpx before end of year > did not do    11/14/2017  f/u ov/Jose Crane re: re-establish re  chronic asthma/ chronic dvt On L / hbp / non-adherence  Chief Complaint  Patient presents with  . Follow-up    Appetite not doing as well recently. His BP is elevated today.   Dyspnea:  Not limited by breathing from desired activities   Cough: no SABA use: once a month 02: none  rec Bisoprolol 5 mg daily  Please see patient coordinator before you leave today  to schedule colonoscopy     01/05/2018  f/u ov/Jose Crane re: dtcasthma/ admits to non-adherence  Chief Complaint  Patient presents with  . Follow-up    1 month follow up. Jose Crane is his primary doctor.   Dyspnea:  A little walking / has elipitical not using / steps ok/ plays baseball Cough: no Sleeping: ok one pillow flat bed SABA use: once or twice a month  02: no   No obvious day to day or daytime variability or assoc excess/ purulent sputum or mucus plugs or hemoptysis or cp or chest tightness, subjective wheeze or overt sinus or hb symptoms.   Sleeping fine  without nocturnal  or early am exacerbation  of respiratory  c/o's or need for noct saba. Also denies any obvious fluctuation of symptoms with weather or environmental changes or other aggravating or alleviating factors except as outlined above   No unusual exposure hx or h/o childhood pna/ asthma or knowledge of premature birth.  Current Allergies, Complete Past Medical History, Past Surgical History, Family History, and Social History were reviewed in [employer] record.  ROS  The following are not active complaints unless bolded Hoarseness, sore throat, dysphagia, dental problems, itching, sneezing,  nasal congestion or discharge of excess mucus or  purulent secretions, ear ache,   fever, chills, sweats, unintended wt loss or wt gain, classically pleuritic or exertional cp,  orthopnea pnd or arm/hand swelling  or leg swelling, presyncope, palpitations, abdominal pain, anorexia, nausea, vomiting, diarrhea  or change in bowel habits or change in bladder habits, change in stools or change in urine, dysuria, hematuria,  rash, arthralgias, visual complaints, headache, numbness, weakness or ataxia or problems with walking or coordination,  change in mood or  memory.        Current Meds  Medication Sig  . albuterol (PROAIR HFA) 108 (90 Base) MCG/ACT inhaler INHALE 2 PUFFS INTO THE LUNGS EVERY 6 HOURS AS NEEDED FOR WHEEZING OR SHORTNESS OF BREATH  . aspirin 325 MG tablet Take 325 mg by mouth daily.  . bisoprolol (ZEBETA) 5 MG tablet Take 1 tablet (5 mg total) by mouth daily.  . metoprolol tartrate (LOPRESSOR) 50 MG tablet Take 1 tablet (50 mg total) by mouth 2 (two) times daily.  . SYMBICORT 160-4.5 MCG/ACT inhaler INHALE 2 PUFFS FIRST THING IN THE MORNING THEN ANOTHER 2 PUFFS ABOUT 12 HOURS LATER                  Past Medical History:  HYPERLIPIDEMIA (ICD-272.4)  ASTHMA (ICD-493.90)  HEALTH MAINTENANCE .............................. Jose Crane - Pneumovax 02/2004 (second shot) and prevnar 13 11/24/2013  - Tdap 05/20/11 - CPX  01/05/2018  Ruptured achilles April 2010  DVT Left leg sp achilles surgery July 2010  - Repeat doppler 04/10/09: Extensive chronic L Leg DVT  - repeat doppler 10/11/09---resolution of DVT in CFV and SFV, residual clot in popliteal and peroneal veins > f/u Jose Crane      Objective:   Physical Exam   Wt 209 March 14, 2008 > 216 May 15, 2009 >  > 222 November 07, 2009 > 221 05/20/2011 > 11/04/2012 196 > 05/28/2013  204 > 11/24/2013  206 > 09/13/2015  199 > 12/18/2015  208 > 02/01/2016  210 >    01/05/2018  201     Vital signs reviewed - Note on arrival 02 sats  98% on RA   And bp 126/84       HEENT: nl dentition, turbinates  bilaterally, and oropharynx. Nl external ear canals without cough reflex   NECK :  without JVD/Nodes/TM/ nl carotid upstrokes bilaterally   LUNGS: no acc muscle use,  Nl contour chest which is clear to A and P bilaterally without cough on insp or exp maneuvers   CV:  RRR  no s3 or murmur or increase in P2, and vericosities LLExt s edema  ABD:  soft and nontender with nl inspiratory  excursion in the supine position. No bruits or organomegaly appreciated, bowel sounds nl  MS:  Nl gait/ ext warm without deformities, calf tenderness, cyanosis or clubbing No obvious joint restrictions   SKIN: warm and dry with mulitiple round nevi , one is slt rectangular L paralumbar but smooth boreders and < pencil eraser   NEURO:  alert, approp, nl sensorium with  no motor or cerebellar deficits apparent.   GU : ? Small R IH/ nl testes s nodules  Rectal:  Per GI planned w/in a week with colonscopy       ekg 01/05/2018   Sinus brady, LAD, no ischemic changes   Labs ordered 01/05/2018    : Allergy profile     Labs ordered/ reviewed:      Chemistry      Component Value Date/Time   NA 136 01/05/2018 0941   K 4.2 01/05/2018 0941   CL 103 01/05/2018 0941   CO2 28 01/05/2018 0941   BUN 17 01/05/2018 0941   CREATININE 1.30 01/05/2018 0941      Component Value Date/Time   CALCIUM 9.6 01/05/2018 0941   ALKPHOS 45 01/05/2018 0941   AST 14 01/05/2018 0941   ALT 23 01/05/2018 0941   BILITOT 0.6 01/05/2018 0941        Lab Results  Component Value Date   WBC 6.6 01/05/2018   HGB 15.6 01/05/2018   HCT 44.7 01/05/2018   MCV 87.0 01/05/2018   PLT 189.0 01/05/2018        EOS                                                              0.5                                    01/05/2018     Lab Results  Component Value Date   TSH 0.86 01/05/2018         Lab Results  Component Value Date   PSA 1.08 01/05/2018      CXR PA and Lateral:   01/05/2018 :    I personally reviewed images and  agree with radiology impression as follows:   Normal exam.   Lab Results  Component Value Date   CHOL 204 (H) 01/05/2018   HDL 55.20 01/05/2018   LDLCALC 126 (H) 01/05/2018   LDLDIRECT 165.9 05/28/2013   TRIG 114.0 01/05/2018   CHOLHDL 4 01/05/2018        Assessment & Plan:

## 2018-01-05 NOTE — Patient Instructions (Addendum)
No change in medications though I recommend you take your aspirin and bisoprolol in am with bfast  Ok to break the bisoprolol in half if you are sluggish and just take a half daily    Please remember to go to the lab and x-ray department downstairs in the basement  for your tests - we will call you with the results when they are available.     Please schedule a follow up visit in 6 months but call sooner if needed

## 2018-01-06 ENCOUNTER — Telehealth: Payer: Self-pay | Admitting: Internal Medicine

## 2018-01-06 LAB — RESPIRATORY ALLERGY PROFILE REGION II ~~LOC~~
ALLERGEN, MOUSE U PROTEIN, E72: 4.19 kU/L — AB
ALLERGEN, OAK, T7: 6.83 kU/L — AB
Allergen, A. alternata, m6: <0.1 kU/L
Allergen, Cedar tree, t12: 0.74 kU/L — ABNORMAL HIGH
Allergen, Comm Silver Birch, t9: 7.18 kU/L — ABNORMAL HIGH
Allergen, Cottonwood, t14: 0.93 kU/L — ABNORMAL HIGH
Allergen, D pternoyssinus,d7: 5.39 kU/L — ABNORMAL HIGH
Allergen, Mulberry, t76: 0.63 kU/L — ABNORMAL HIGH
Allergen, P. notatum, m1: <0.1 kU/L
Bermuda Grass: 1 kU/L — ABNORMAL HIGH
Box Elder IgE: 1.27 kU/L — ABNORMAL HIGH
CAT DANDER: 1.59 kU/L — AB
CLADOSPORIUM HERBARUM (M2) IGE: <0.1 kU/L
CLASS: 0
CLASS: 0
CLASS: 1
CLASS: 2
CLASS: 2
CLASS: 2
CLASS: 2
CLASS: 2
CLASS: 2
CLASS: 4
COMMON RAGWEED (SHORT) (W1) IGE: 1.67 kU/L — ABNORMAL HIGH
Class: 0
Class: 0
Class: 2
Class: 2
Class: 2
Class: 2
Class: 2
Class: 2
Class: 2
Class: 3
Class: 3
Class: 3
Class: 3
Class: 3
Cockroach: 0.86 kU/L — ABNORMAL HIGH
D. farinae: 7.31 kU/L — ABNORMAL HIGH
Dog Dander: 39 kU/L — ABNORMAL HIGH
Elm IgE: 1.46 kU/L — ABNORMAL HIGH
IgE (Immunoglobulin E), Serum: 598 kU/L — ABNORMAL HIGH (ref <=114)
Johnson Grass: 1.32 kU/L — ABNORMAL HIGH
Pecan/Hickory Tree IgE: 0.7 kU/L — ABNORMAL HIGH
ROUGH PIGWEED IGE: 1.88 kU/L — AB
Sheep Sorrel IgE: 0.9 kU/L — ABNORMAL HIGH
TIMOTHY GRASS: 1.63 kU/L — AB

## 2018-01-06 LAB — INTERPRETATION:

## 2018-01-06 NOTE — Telephone Encounter (Signed)
Called pt and advised message from the provider. Pt understood and verbalized understanding. Nothing further is needed.    

## 2018-01-06 NOTE — Telephone Encounter (Signed)
  Advised pt of results. He wanted to know why a HGB A1c wasn't added to his blood work. Should he have gotten this done? MW please advise.    Notes recorded by Tanda Rockers, MD on 01/05/2018 at 3:56 PM EDT Call patient : Studies are unremarkable, no change in recs  Notes recorded by Tanda Rockers, MD on 01/05/2018 at 3:55 PM EDT Call pt: Reviewed cxr and no acute change so no change in recommendations made at [facility] Documents:   There are no encounter-level documents.  Order-Level Documents:   There are no order-level documents.  Vitals   Height Weight BMI (Calculated)  5\' 11"  (1.803 m) 201 lb (91.2 kg) 28.05  Imaging   Imaging Information  Resulted by:   Signed Date/Time  Phone Pager  Lorriane Shire 01/05/2018 11:59 AM    Result Notes for DG Chest 2 View   Notes recorded by Rosana Berger, CMA on 01/05/2018 at 4:00 PM EDT Spoke with pt and notified of results per Dr. Melvyn Novas. Pt verbalized understanding and denied any questions.  ------  Notes recorded by Tanda Rockers, MD on 01/05/2018 at 3:55 PM EDT Call pt: Reviewed cxr and no acute change so no change in recommendations made at [facility] Notes    Marnee Spring on 01/05/2018 9:40 AM  Routine, no complains, HTN, hx of asthma, x-smoker.     Original Order   Ordered On Ordered By   01/05/2018 9:19 AM Tanda Rockers, MD           External Result Report   External Result Report

## 2018-01-06 NOTE — Telephone Encounter (Signed)
This is only done for pt's with boderline dm or full blown dm, he has no indication at all of either conditions so insurance does not typically pay for tests that are not indicated

## 2018-01-07 ENCOUNTER — Other Ambulatory Visit: Payer: Self-pay | Admitting: *Deleted

## 2018-01-07 ENCOUNTER — Encounter: Payer: Self-pay | Admitting: Internal Medicine

## 2018-01-07 NOTE — Assessment & Plan Note (Signed)
-   Prevnar 13 given 11/24/13  - referred for colonoscopy 11/14/2017  > scheduled   Up to date

## 2018-01-07 NOTE — Assessment & Plan Note (Addendum)
09/03/2010 > decreased symbicort 80/4.56mg  -  11/04/2012  > increased symbicort to 160 bid as not consistent with use    - 11/14/2017  After extensive coaching inhaler device  effectiveness =    90% > rec continue symb 160 2bid  - Spirometry 01/05/2018  FEV1 4.5 (111%)  Ratio 90 p no rx prior   All goals of chronic asthma control met including optimal function and elimination of symptoms with minimal need for rescue therapy.  Contingencies discussed in full including contacting this office immediately if not controlling the symptoms using the rule of two's.

## 2018-01-07 NOTE — Assessment & Plan Note (Signed)
Adequate control on present rx, reviewed in detail with pt > no change in rx needed   

## 2018-01-08 NOTE — Progress Notes (Signed)
Spoke with pt and notified of results per Dr. [name]. Pt verbalized understanding and denied any questions. 

## 2018-01-09 ENCOUNTER — Other Ambulatory Visit: Payer: Self-pay | Admitting: Internal Medicine

## 2018-01-09 ENCOUNTER — Encounter: Payer: Self-pay | Admitting: Gastroenterology

## 2018-01-09 ENCOUNTER — Ambulatory Visit (AMBULATORY_SURGERY_CENTER): Payer: BLUE CROSS/BLUE SHIELD | Admitting: Gastroenterology

## 2018-01-09 VITALS — BP 129/63 | HR 61 | Temp 99.5°F | Resp 11 | Ht 71.0 in | Wt 201.0 lb

## 2018-01-09 DIAGNOSIS — D120 Benign neoplasm of cecum: Secondary | ICD-10-CM

## 2018-01-09 DIAGNOSIS — Z1211 Encounter for screening for malignant neoplasm of colon: Secondary | ICD-10-CM | POA: Diagnosis present

## 2018-01-09 MED ORDER — SODIUM CHLORIDE 0.9 % IV SOLN: 500.0000 mL | Freq: Once | INTRAVENOUS | Status: DC

## 2018-01-09 NOTE — Progress Notes (Signed)
To PACU, VSS. Report to RN.tb 

## 2018-01-09 NOTE — Patient Instructions (Addendum)
Impression/Recommendations:  Polyp handout given to patient. Hemorrhoid handout given to patient.  Repeat colonoscopy for surveillance.  Date to be determined after pathology results reviewed.  Resume previous diet. Continue present medications.  No aspirin, Ibuprofen, Naproxen, or other NSAID drugs for 2 weeks.  Tylenol only until 01/23/2018.  Await pathology results.  YOU HAD AN ENDOSCOPIC PROCEDURE TODAY AT [facility]:   Refer to the procedure report that was given to you for any specific questions about what was found during the examination.  If the procedure report does not answer your questions, please call your gastroenterologist to clarify.  If you requested that your care partner not be given the details of your procedure findings, then the procedure report has been included in a sealed envelope for you to review at your convenience later.  YOU SHOULD EXPECT: Some feelings of bloating in the abdomen. Passage of more gas than usual.  Walking can help get rid of the air that was put into your GI tract during the procedure and reduce the bloating. If you had a lower endoscopy (such as a colonoscopy or flexible sigmoidoscopy) you may notice spotting of blood in your stool or on the toilet paper. If you underwent a bowel prep for your procedure, you may not have a normal bowel movement for a few days.  Please Note:  You might notice some irritation and congestion in your nose or some drainage.  This is from the oxygen used during your procedure.  There is no need for concern and it should clear up in a day or so.  SYMPTOMS TO REPORT IMMEDIATELY:   Following lower endoscopy (colonoscopy or flexible sigmoidoscopy):  Excessive amounts of blood in the stool  Significant tenderness or worsening of abdominal pains  Swelling of the abdomen that is new, acute  Fever of 100F or higher   For urgent or emergent issues, a gastroenterologist can be reached at any hour by  calling [phone].   DIET:  We do recommend a small meal at first, but then you may proceed to your regular diet.  Drink plenty of fluids but you should avoid alcoholic beverages for 24 hours.  ACTIVITY:  You should plan to take it easy for the rest of today and you should NOT DRIVE or use heavy machinery until tomorrow (because of the sedation medicines used during the test).    FOLLOW UP: Our staff will call the number listed on your records the next business day following your procedure to check on you and address any questions or concerns that you may have regarding the information given to you following your procedure. If we do not reach you, we will leave a message.  However, if you are feeling well and you are not experiencing any problems, there is no need to return our call.  We will assume that you have returned to your regular daily activities without incident.  If any biopsies were taken you will be contacted by phone or by letter within the next 1-3 weeks.  Please call us at [phone] if you have not heard about the biopsies in 3 weeks.    SIGNATURES/CONFIDENTIALITY: You and/or your care partner have signed paperwork which will be entered into your electronic medical record.  These signatures attest to the fact that that the information above on your After Visit Summary has been reviewed and is understood.  Full responsibility of the confidentiality of this discharge information lies with you and/or your care-partner.

## 2018-01-09 NOTE — Progress Notes (Signed)
Called to room to assist during endoscopic procedure.  Patient ID and intended procedure confirmed with present staff. Received instructions for my participation in the procedure from the performing physician.  

## 2018-01-09 NOTE — Progress Notes (Signed)
Told pt. To d/c 325 mg. ASA qd during 2 weeks post polyp removal period to avoid bleeding.

## 2018-01-09 NOTE — Op Note (Signed)
[place] Patient Name: Jose Crane Procedure Date: 01/09/2018 1:57 PM MRN: [id number] Endoscopist: Ladene Artist , MD Age: 52 Referring MD:  Date of Birth: 1966-02-08 Gender: Male Account #: [id number] Procedure:                Colonoscopy Indications:              Screening for colorectal malignant neoplasm Medicines:                Monitored Anesthesia Care Procedure:                Pre-Anesthesia Assessment:                           - Prior to the procedure, a History and Physical                            was performed, and patient medications and                            allergies were reviewed. The patient's tolerance of                            previous anesthesia was also reviewed. The risks                            and benefits of the procedure and the sedation                            options and risks were discussed with the patient.                            All questions were answered, and informed consent                            was obtained. Prior Anticoagulants: The patient has                            taken no previous anticoagulant or antiplatelet                            agents. ASA Grade Assessment: II - A patient with                            mild systemic disease. After reviewing the risks                            and benefits, the patient was deemed in                            satisfactory condition to undergo the procedure.                           After obtaining informed consent, the colonoscope  was passed under direct vision. Throughout the                            procedure, the patient's blood pressure, pulse, and                            oxygen saturations were monitored continuously. The                            Model CF-HQ190L [id number]) scope was introduced                            through the anus and advanced to the the cecum,                            identified by  appendiceal orifice and ileocecal                            valve. The ileocecal valve, appendiceal orifice,                            and rectum were photographed. The quality of the                            bowel preparation was good after extensive lavage                            and suctioning. The colonoscopy was performed                            without difficulty. The patient tolerated the                            procedure well. Scope In: 2:06:59 PM Scope Out: 2:26:38 PM Scope Withdrawal Time: 0 hours 16 minutes 19 seconds  Total Procedure Duration: 0 hours 19 minutes 39 seconds  Findings:                 The perianal and digital rectal examinations were                            normal.                           A 8 mm polyp was found in the cecum. The polyp was                            sessile. The polyp was removed with a hot snare                            after cold snare was not successful. Resection and                            retrieval were complete.  Internal hemorrhoids were found during                            retroflexion. The hemorrhoids were small and Grade                            I (internal hemorrhoids that do not prolapse).                           The exam was otherwise without abnormality on                            direct and retroflexion views. Complications:            No immediate complications. Estimated blood loss:                            None. Estimated Blood Loss:     Estimated blood loss: none. Impression:               - One 8 mm polyp in the cecum, removed with a hot                            snare. Resected and retrieved.                           - Internal hemorrhoids.                           - The examination was otherwise normal on direct                            and retroflexion views. Recommendation:           - Repeat colonoscopy in 5 years for surveillance if                             polyp is precancerous, otherwise 10 years with a                            more extensive bowel prep.                           - Patient has a contact number available for                            emergencies. The signs and symptoms of potential                            delayed complications were discussed with the                            patient. Return to normal activities tomorrow.                            Written discharge instructions were provided to the  patient.                           - Resume previous diet.                           - Continue present medications.                           - Await pathology results.                           - No aspirin, ibuprofen, naproxen, or other                            non-steroidal anti-inflammatory drugs for 2 weeks                            after polyp removal. Ladene Artist, MD 01/09/2018 2:29:30 PM This report has been signed electronically.

## 2018-01-12 ENCOUNTER — Telehealth: Payer: Self-pay | Admitting: *Deleted

## 2018-01-12 NOTE — Telephone Encounter (Signed)
No answer, message left for the patient. 

## 2018-01-26 ENCOUNTER — Encounter: Payer: Self-pay | Admitting: Gastroenterology

## 2018-07-10 ENCOUNTER — Ambulatory Visit: Payer: BLUE CROSS/BLUE SHIELD | Admitting: Pulmonary Disease

## 2018-08-17 ENCOUNTER — Other Ambulatory Visit: Payer: Self-pay

## 2018-08-17 ENCOUNTER — Ambulatory Visit (INDEPENDENT_AMBULATORY_CARE_PROVIDER_SITE_OTHER): Payer: BLUE CROSS/BLUE SHIELD | Admitting: Internal Medicine

## 2018-08-17 DIAGNOSIS — I10 Essential (primary) hypertension: Secondary | ICD-10-CM

## 2018-08-17 DIAGNOSIS — J4540 Moderate persistent asthma, uncomplicated: Secondary | ICD-10-CM | POA: Diagnosis not present

## 2018-08-17 NOTE — Progress Notes (Signed)
Subjective:    Patient ID: Jose Crane, male    DOB: 1966/02/19    MRN: [id number]    Brief patient profile:  30   yowm never smoker with a history of childhood asthma & status asthmaticus requiring mechanical ventilation 07/1995 at [facility].  He typically uses Symbicort, more on an as-needed basis rather than every day. He has only needed albuterol  rarely    History of Present Illness  09/13/2015  f/u ov/[name] re:  Chronic asthma on symbicort 160 2 each am/very rare saba  Chief Complaint  Patient presents with  . Follow-up    Pt c/o "sensation" on the left side of his neck x 2 months.   area on neck is actually just under the mandible where he had recent dental surgery and denies ongoing dental concerns, pain, fever or chewing issues. No assoc ear ache or sinus problems  rec No change in recs   12/18/2015  f/u ov/[name] re:  Chronic asthma/ no flares since last ov  Chief Complaint  Patient presents with  . Follow-up    Pt states still having discomfort in neck on the left side. No new co's today.   sometimes does not have the neck discomfort and never wakes at night with it Present daily though since tooth work  avg symbicort 2 /4   Maybe saba once a week / ex tol good / sleeping ok  Not limited by breathing from desired activities   rec Plan A = Automatic = Symbicort 160 Take 2 puffs first thing in am and then another 2 puffs about 12 hours later.  Plan B = Backup Only use your albuterol as a rescue medication   CPX due 04/2016    PC 01/26/16 new L calf pain > venous doppler Pos sub phlebitis   02/01/2016 acute extended ov/[name] re: recurrent L sup phebitis  Chief Complaint  Patient presents with  . Acute Visit    Here to discuss venous u/s results. He is still having left leg pain, but this has improved some.    some better p aleve/ elevation/ heat  Breathing some worse with cough / congestion no purulent or bloody sputum  rec Please see patient coordinator before you leave  today  to schedule evaluation by vascular surgery asap (w/in next week or two at most) If breathing or leg get worse > Prednisone 10 mg take  4 each am x 2 days,   2 each am x 2 days,  1 each am x 2 days and stop  Schedule cpx before end of year > did not do    11/14/2017  f/u ov/[name] re: re-establish re  chronic asthma/ chronic dvt On L / hbp / non-adherence  Chief Complaint  Patient presents with  . Follow-up    Appetite not doing as well recently. His BP is elevated today.   Dyspnea:  Not limited by breathing from desired activities   Cough: no SABA use: once a month 02: none  rec Bisoprolol 5 mg daily  Please see patient coordinator before you leave today  to schedule colonoscopy     01/05/2018  f/u ov/[name] re: dtcasthma/ admits to non-adherence  Chief Complaint  Patient presents with  . Follow-up    1 month follow up. MW is his primary doctor.   Dyspnea:  A little walking / has elipitical not using / steps ok/ plays baseball Cough: no Sleeping: ok one pillow flat bed SABA use: once or twice a month  rec No change in medications though I recommend you take your aspirin and bisoprolol in am with bfast Ok to break the bisoprolol in half if you are sluggish and just take a half daily    Virtual Visit via Telephone Note 08/17/2018 re asthma/hbp  I connected with Jose Crane on 08/17/18 at  9:30 AM EDT by telephone and verified that I am speaking with the correct person using two identifiers.   I discussed the limitations, risks, security and privacy concerns of performing an evaluation and management service by telephone and the availability of in person appointments. I also discussed with the patient that there may be a patient responsible charge related to this service. The patient expressed understanding and agreed to proceed.   History of Present Illness: Got sick p trip to [facility] in February 2020 with n and v and cough/wheeze/ hoarseness  but increased saba and resolved  one week prior to phone OV s intervention  Dyspnea:  Treadmill ok but not quite back to baseline x 72min x flat, 20 min  Cough: much better  Sleeping: not disrupting  sleep SABA use: not now  02: none  No longer taking bisprolol    No obvious day to day or daytime variability or assoc excess/ purulent sputum or mucus plugs or hemoptysis or cp or chest tightness, subjective wheeze or overt sinus or hb symptoms.    Also denies any obvious fluctuation of symptoms with weather or environmental changes or other aggravating or alleviating factors except as outlined above.   Meds reviewed/ med reconciliation completed         Past Medical History:  HYPERLIPIDEMIA (ICD-272.4)  ASTHMA (ICD-493.90)  HEALTH MAINTENANCE .............................. [name] - Pneumovax 02/2004 (second shot) and prevnar 13 11/24/2013  - Tdap 05/20/11 - CPX  01/05/2018  Ruptured achilles April 2010  DVT Left leg sp achilles surgery July 2010  - Repeat doppler 04/10/09: Extensive chronic L Leg DVT  - repeat doppler 10/11/09---resolution of DVT in CFV and SFV, residual clot in popliteal and peroneal veins > f/u Kellie Simmering           Observations/Objective: Sounds great, talking in full sentences/ minimally hoarse    Assessment and Plan: See problem list for active a/p's   Follow Up Instructions: See avs for instructions unique to this ov which includes revised/ updated med list     I discussed the assessment and treatment plan with the patient. The patient was provided an opportunity to ask questions and all were answered. The patient agreed with the plan and demonstrated an understanding of the instructions.   The patient was advised to call back or seek an in-person evaluation if the symptoms worsen or if the condition fails to improve as anticipated.  I provided 25 minutes of non-face-to-face time during this encounter.   Christinia Gully, MD

## 2018-08-17 NOTE — Patient Instructions (Signed)
No change in medications though ok to restart your bisoprolol if bp over 140/85 consistently  Please schedule a follow up visit in 5 months but call sooner if needed  with all medications /inhalers/ solutions in hand so we can verify exactly what you are taking. This includes all medications from all doctors and over the counters - For CPX on return

## 2018-08-18 ENCOUNTER — Encounter: Payer: Self-pay | Admitting: Internal Medicine

## 2018-08-18 NOTE — Assessment & Plan Note (Signed)
Onset in childhood/ req mech vent 1997  09/03/2010 > decreased symbicort 80/4.26mg  -  11/04/2012  > increased symbicort to 160 bid as not consistent with use  - 11/14/2017  After extensive coaching inhaler device  effectiveness =    90% > rec continue symb 160 2bid  - Spirometry 01/05/2018  FEV1 4.5 (111%)  Ratio 90 p no rx prior  - Allergy profile 01/05/18  >  Eos 0.5 /  IgE  598 RAST pos everything but mold  Able to get thru viral syndrome in feb with use of hfa saba, now All goals of chronic asthma control met including optimal function and elimination of symptoms with minimal need for rescue therapy.  Contingencies discussed in full including contacting this office immediately if not controlling the symptoms using the rule of two's.

## 2018-08-18 NOTE — Assessment & Plan Note (Addendum)
Adequate control on present rx, reviewed in detail with pt > no change in rx needed  = self monitor, add back bisoprolol if needed    Each maintenance medication was reviewed in detail including most importantly the difference between maintenance and as needed and under what circumstances the prns are to be used.  Please see AVS for specific  Instructions which are unique to this visit and I personally typed out  which were reviewed in detail in  with the patient and a copy provided.

## 2019-01-18 ENCOUNTER — Ambulatory Visit: Payer: BLUE CROSS/BLUE SHIELD | Admitting: Internal Medicine

## 2019-04-07 ENCOUNTER — Telehealth: Payer: Self-pay | Admitting: Internal Medicine

## 2019-04-07 NOTE — Telephone Encounter (Signed)
Spoke with pt. Advised him that we no longer have savings coupons for Symbicort as it now a generic medication. Pt is going to check with this pharmacy and let us know if the generic Symbicort will be to expensive for him. Nothing further was needed at this time.

## 2019-04-14 ENCOUNTER — Other Ambulatory Visit: Payer: Self-pay | Admitting: Internal Medicine

## 2019-04-14 MED ORDER — BUDESONIDE-FORMOTEROL FUMARATE 160-4.5 MCG/ACT IN AERO: INHALATION_SPRAY | RESPIRATORY_TRACT | 3 refills | Status: DC

## 2019-06-24 ENCOUNTER — Telehealth: Payer: Self-pay | Admitting: Internal Medicine

## 2019-06-24 NOTE — Telephone Encounter (Signed)
Called and spoke to pt. Pt states for the last 1-2 months he has been having slight tenderness to the external right side of his neck and very mild sore throat. Pt also c/o hoarseness and intermittent dry cough. Pt denies SOB, chest pain/tightness, sinus congestion, f/c/s, body aches. Pt was offered a virtual visit but wishes to come into office. Pt denies known covid contacts. Pt was scheduled for a visit with MW for 3/3.   Dr. Melvyn Novas please advise if you are ok having pt come in for in person visit. Thanks.

## 2019-06-25 NOTE — Telephone Encounter (Signed)
Spoke with the pt and notified of recs per MW  Nothing further needed

## 2019-06-25 NOTE — Telephone Encounter (Signed)
Based on chronicity ok to keep appt

## 2019-06-30 ENCOUNTER — Ambulatory Visit (INDEPENDENT_AMBULATORY_CARE_PROVIDER_SITE_OTHER): Payer: BC Managed Care – PPO | Admitting: Internal Medicine

## 2019-06-30 ENCOUNTER — Encounter: Payer: Self-pay | Admitting: Internal Medicine

## 2019-06-30 ENCOUNTER — Other Ambulatory Visit: Payer: Self-pay

## 2019-06-30 DIAGNOSIS — J4540 Moderate persistent asthma, uncomplicated: Secondary | ICD-10-CM

## 2019-06-30 DIAGNOSIS — J029 Acute pharyngitis, unspecified: Secondary | ICD-10-CM | POA: Diagnosis not present

## 2019-06-30 DIAGNOSIS — I10 Essential (primary) hypertension: Secondary | ICD-10-CM | POA: Diagnosis not present

## 2019-06-30 NOTE — Assessment & Plan Note (Signed)
Onset late feb 2021  > min erythema on exam 06/30/2019   ddx irritation from symb 160/ viral or gerd related   rec  Baking soda toothpaste p symb F/u ent if not improving

## 2019-06-30 NOTE — Assessment & Plan Note (Signed)
Not ideal, rec f/u self monitor and start back on bisoprolol half dose if over 140/85  cpx due 3 m          Each maintenance medication was reviewed in detail including emphasizing most importantly the difference between maintenance and prns and under what circumstances the prns are to be triggered using an action plan format where appropriate.  Total time for H and P, chart review, counseling, teaching device and generating customized AVS unique to this acute office visit / charting = 30 min

## 2019-06-30 NOTE — Progress Notes (Signed)
Subjective:    Patient ID: Jose Crane, male    DOB: Aug 25, 1965    MRN: [id number]    Brief patient profile:  38  yowm never smoker with a history of childhood asthma & status asthmaticus requiring mechanical ventilation 4/97 at [facility].  He typically uses Symbicort, more on an as-needed basis rather than every day. He has only needed albuterol  rarely    History of Present Illness  09/13/2015  f/u ov/Jose Crane re:  Chronic asthma on symbicort 160 2 each am/very rare saba  Chief Complaint  Patient presents with  . Follow-up    Pt c/o "sensation" on the left side of his neck x 2 months.   area on neck is actually just under the mandible where he had recent dental surgery and denies ongoing dental concerns, pain, fever or chewing issues. No assoc ear ache or sinus problems  rec No change in recs   12/18/2015  f/u ov/Jose Crane re:  Chronic asthma/ no flares since last ov  Chief Complaint  Patient presents with  . Follow-up    Pt states still having discomfort in neck on the left side. No new co's today.   sometimes does not have the neck discomfort and never wakes at night with it Present daily though since tooth work  avg symbicort 2 /4   Maybe saba once a week / ex tol good / sleeping ok  Not limited by breathing from desired activities   rec Plan A = Automatic = Symbicort 160 Take 2 puffs first thing in am and then another 2 puffs about 12 hours later.  Plan B = Backup Only use your albuterol as a rescue medication   CPX due 04/2016    PC 01/26/16 new L calf pain > venous doppler Pos sub phlebitis   02/01/2016 acute extended ov/Jose Crane re: recurrent L sup phebitis  Chief Complaint  Patient presents with  . Acute Visit    Here to discuss venous u/s results. He is still having left leg pain, but this has improved some.    some better p aleve/ elevation/ heat  Breathing some worse with cough / congestion no purulent or bloody sputum  rec Please see patient coordinator before you leave  today  to schedule evaluation by vascular surgery asap (w/in next week or two at most) If breathing or leg get worse > Prednisone 10 mg take  4 each am x 2 days,   2 each am x 2 days,  1 each am x 2 days and stop  Schedule cpx before end of year > did not do    11/14/2017  f/u ov/Jose Crane re: re-establish re  chronic asthma/ chronic dvt On L / hbp / non-adherence  Chief Complaint  Patient presents with  . Follow-up    Appetite not doing as well recently. His BP is elevated today.   Dyspnea:  Not limited by breathing from desired activities   Cough: no SABA use: once a month 02: none  rec Bisoprolol 5 mg daily  Please see patient coordinator before you leave today  to schedule colonoscopy     01/05/2018  f/u ov/Jose Crane re: dtcasthma/ admits to non-adherence  Chief Complaint  Patient presents with  . Follow-up    1 month follow up. MW is his primary doctor.   Dyspnea:  A little walking / has elipitical not using / steps ok/ plays baseball Cough: no Sleeping: ok one pillow flat bed SABA use: once or twice a month 02:  no rec No change in medications though I recommend you take your aspirin and bisoprolol in am with bfast Ok to break the bisoprolol in half if you are sluggish and just take a half daily     06/30/2019  Acute  ov/Jose Crane re:  Chronic asthma/ not compliant with symb or bisoprolol  Chief Complaint  Patient presents with  . Acute Visit    Left side neck discomfort x 2 wks- constant and also has some sore throat.   Dyspnea:  Not limited by breathing from desired activities  / did treadmill x 25 min x 3.7 mph Cough: none Sleeping: ok flat SABA use: zero 02: none St without assoc ear/ sinus complaints no fever    No obvious day to day or daytime variability or assoc excess/ purulent sputum or mucus plugs or hemoptysis or cp or chest tightness, subjective wheeze or overt sinus or hb symptoms.   Sleeping without nocturnal  or early am exacerbation  of respiratory  c/o's or need  for noct saba. Also denies any obvious fluctuation of symptoms with weather or environmental changes or other aggravating or alleviating factors except as outlined above   No unusual exposure hx or h/o childhood pna or knowledge of premature birth.  Current Allergies, Complete Past Medical History, Past Surgical History, Family History, and Social History were reviewed in [employer] record.  ROS  The following are not active complaints unless bolded Hoarseness, sore throat, dysphagia, dental problems, itching, sneezing,  nasal congestion or discharge of excess mucus or purulent secretions, ear ache,   fever, chills, sweats, unintended wt loss or wt gain, classically pleuritic or exertional cp,  orthopnea pnd or arm/hand swelling  or leg swelling, presyncope, palpitations, abdominal pain, anorexia, nausea, vomiting, diarrhea  or change in bowel habits or change in bladder habits, change in stools or change in urine, dysuria, hematuria,  rash, arthralgias, visual complaints, headache, numbness, weakness or ataxia or problems with walking or coordination,  change in mood or  memory.        Current Meds  Medication Sig  . albuterol (PROAIR HFA) 108 (90 Base) MCG/ACT inhaler INHALE 2 PUFFS INTO THE LUNGS EVERY 6 HOURS AS NEEDED FOR WHEEZING OR SHORTNESS OF BREATH  . aspirin 325 MG tablet Take 325 mg by mouth daily.  . budesonide-formoterol (SYMBICORT) 160-4.5 MCG/ACT inhaler INHALE 2 PUFFS FIRST THING IN THE MORNING THEN ANOTHER 2 PUFFS ABOUT 12 HOURS LATER          Past Medical History:  HYPERLIPIDEMIA (ICD-272.4)  ASTHMA (ICD-493.90)  HEALTH MAINTENANCE .............................. Jose Crane - Pneumovax 02/2004 (second shot) and prevnar 13 11/24/2013  - Tdap 05/20/11 - CPX  01/05/2018  Ruptured achilles April 2010  DVT Left leg sp achilles surgery July 2010  - Repeat doppler 04/10/09: Extensive chronic L Leg DVT  - repeat doppler 10/11/09---resolution of DVT in CFV and SFV,  residual clot in popliteal and peroneal veins > f/u Jose Crane      Objective:   Physical Exam   06/30/2019  203  01/05/2018  201 Wt 209 March 14, 2008 > 216 May 15, 2009 >  > 222 November 07, 2009 > 221 05/20/2011 > 11/04/2012 196 > 05/28/2013  204 > 11/24/2013  206 > 09/13/2015  199 > 12/18/2015  208 > 02/01/2016  210 >          Vital signs reviewed  06/30/2019  - Note at rest 02 sats  98% on RA      HEENT :  scarring both TM's/no nodes/ min erythema of orophx s tonsilar enlargement    NECK :  without JVD/Nodes/TM/ nl carotid upstrokes bilaterally   LUNGS: no acc muscle use,  Nl contour chest which is clear to A and P bilaterally without cough on insp or exp maneuvers   CV:  RRR  no s3 or murmur or increase in P2, and no edema   ABD:  soft and nontender with nl inspiratory excursion in the supine position. No bruits or organomegaly appreciated, bowel sounds nl  MS:  Nl gait/ ext warm without deformities, calf tenderness, cyanosis or clubbing No obvious joint restrictions   SKIN: warm and dry without lesions    NEURO:  alert, approp, nl sensorium with  no motor or cerebellar deficits apparent.             Assessment & Plan:

## 2019-06-30 NOTE — Assessment & Plan Note (Signed)
Onset in childhood/ req mech vent 1997  09/03/2010 > decreased symbicort 80/4.1mg  -  11/04/2012  > increased symbicort to 160 bid as not consistent with use  - 11/14/2017  After extensive coaching inhaler device  effectiveness =    90% > rec continue symb 160 2bid  - Spirometry 01/05/2018  FEV1 4.5 (111%)  Ratio 90 p no rx prior  - Allergy profile 01/05/18  >  Eos 0.5 /  IgE  598 RAST pos everything but mold  Despite non adherence, All goals of chronic asthma control met including optimal function and elimination of symptoms with minimal need for rescue therapy.  Contingencies discussed in full including contacting this office immediately if not controlling the symptoms using the rule of two's.     No change rx

## 2019-06-30 NOTE — Patient Instructions (Signed)
Brush teeth and tongue with arm and hammer toothpaste and make a slurry and gargle with it after using your symbicort   If throat gets worse call for ent referral    Please schedule a follow up visit in 3 months but call sooner if needed for CPX and nothing to eat or drink after midnight in am

## 2019-07-06 ENCOUNTER — Telehealth: Payer: Self-pay | Admitting: Internal Medicine

## 2019-07-06 DIAGNOSIS — J029 Acute pharyngitis, unspecified: Secondary | ICD-10-CM

## 2019-07-06 NOTE — Telephone Encounter (Signed)
Called and spoke with pt. Pt wants to be referred to ENT now due to still having throat pain.  Dr. Melvyn Novas, please advise if you have a preference in which ENT pt is referred to.

## 2019-07-06 NOTE — Telephone Encounter (Signed)
Referral placed and spoke with the pt and notified that this was done

## 2019-07-06 NOTE — Telephone Encounter (Signed)
[facility]

## 2019-07-07 ENCOUNTER — Other Ambulatory Visit: Payer: Self-pay | Admitting: Internal Medicine

## 2019-07-07 DIAGNOSIS — J029 Acute pharyngitis, unspecified: Secondary | ICD-10-CM

## 2019-07-10 DIAGNOSIS — R070 Pain in throat: Secondary | ICD-10-CM | POA: Insufficient documentation

## 2019-07-10 DIAGNOSIS — B370 Candidal stomatitis: Secondary | ICD-10-CM

## 2019-07-10 DIAGNOSIS — B3781 Candidal esophagitis: Secondary | ICD-10-CM | POA: Insufficient documentation

## 2019-07-10 DIAGNOSIS — K219 Gastro-esophageal reflux disease without esophagitis: Secondary | ICD-10-CM | POA: Insufficient documentation

## 2019-07-10 HISTORY — DX: Candidal stomatitis: B37.0

## 2019-07-26 ENCOUNTER — Other Ambulatory Visit: Payer: Self-pay | Admitting: Otolaryngology

## 2019-07-26 DIAGNOSIS — R070 Pain in throat: Secondary | ICD-10-CM

## 2019-08-12 ENCOUNTER — Ambulatory Visit
Admission: RE | Admit: 2019-08-12 | Discharge: 2019-08-12 | Disposition: A | Discharge status: Home / Self Care | Payer: BC Managed Care – PPO | Source: Ambulatory Visit | Attending: Otolaryngology | Admitting: Otolaryngology

## 2019-08-12 DIAGNOSIS — R070 Pain in throat: Secondary | ICD-10-CM

## 2019-08-12 MED ORDER — IOPAMIDOL (ISOVUE-300) INJECTION 61%
75.0000 mL | Freq: Once | INTRAVENOUS | Status: AC | PRN
  Administered 2019-08-12: 75 mL via INTRAVENOUS

## 2019-10-04 ENCOUNTER — Encounter: Payer: Self-pay | Admitting: Internal Medicine

## 2019-10-04 ENCOUNTER — Telehealth: Payer: Self-pay | Admitting: Internal Medicine

## 2019-10-04 ENCOUNTER — Other Ambulatory Visit: Payer: Self-pay

## 2019-10-04 ENCOUNTER — Ambulatory Visit (INDEPENDENT_AMBULATORY_CARE_PROVIDER_SITE_OTHER): Payer: BC Managed Care – PPO | Admitting: Internal Medicine

## 2019-10-04 DIAGNOSIS — J4540 Moderate persistent asthma, uncomplicated: Secondary | ICD-10-CM

## 2019-10-04 DIAGNOSIS — Z0000 Encounter for general adult medical examination without abnormal findings: Secondary | ICD-10-CM

## 2019-10-04 DIAGNOSIS — E785 Hyperlipidemia, unspecified: Secondary | ICD-10-CM

## 2019-10-04 DIAGNOSIS — I10 Essential (primary) hypertension: Secondary | ICD-10-CM

## 2019-10-04 LAB — CBC WITH DIFFERENTIAL/PLATELET
Basophils Absolute: 0 10*3/uL (ref 0.0–0.1)
Basophils Relative: 0.7 % (ref 0.0–3.0)
Eosinophils Absolute: 0.5 10*3/uL (ref 0.0–0.7)
Eosinophils Relative: 7.7 % — ABNORMAL HIGH (ref 0.0–5.0)
HCT: 46.2 % (ref 39.0–52.0)
Hemoglobin: 15.7 g/dL (ref 13.0–17.0)
Lymphocytes Relative: 32.6 % (ref 12.0–46.0)
Lymphs Abs: 2 10*3/uL (ref 0.7–4.0)
MCHC: 34 g/dL (ref 30.0–36.0)
MCV: 88.2 fl (ref 78.0–100.0)
Monocytes Absolute: 0.4 10*3/uL (ref 0.1–1.0)
Monocytes Relative: 5.8 % (ref 3.0–12.0)
Neutro Abs: 3.2 10*3/uL (ref 1.4–7.7)
Neutrophils Relative %: 53.2 % (ref 43.0–77.0)
Platelets: 183 10*3/uL (ref 150.0–400.0)
RBC: 5.24 Mil/uL (ref 4.22–5.81)
RDW: 13.2 % (ref 11.5–15.5)
WBC: 6.1 10*3/uL (ref 4.0–10.5)

## 2019-10-04 LAB — LIPID PANEL
Cholesterol: 229 mg/dL — ABNORMAL HIGH (ref 0–200)
HDL: 56.9 mg/dL (ref >39.00)
LDL Cholesterol: 148 mg/dL — ABNORMAL HIGH (ref 0–99)
NonHDL: 172.51
Total CHOL/HDL Ratio: 4
Triglycerides: 123 mg/dL (ref 0.0–149.0)
VLDL: 24.6 mg/dL (ref 0.0–40.0)

## 2019-10-04 LAB — BASIC METABOLIC PANEL
BUN: 23 mg/dL (ref 6–23)
CO2: 28 mEq/L (ref 19–32)
Calcium: 9.4 mg/dL (ref 8.4–10.5)
Chloride: 101 mEq/L (ref 96–112)
Creatinine, Ser: 1.31 mg/dL (ref 0.40–1.50)
GFR: 57.49 mL/min — ABNORMAL LOW (ref >60.00)
Glucose, Bld: 103 mg/dL — ABNORMAL HIGH (ref 70–99)
Potassium: 4 mEq/L (ref 3.5–5.1)
Sodium: 134 mEq/L — ABNORMAL LOW (ref 135–145)

## 2019-10-04 LAB — TSH: TSH: 1.43 u[IU]/mL (ref 0.35–4.50)

## 2019-10-04 LAB — HEPATIC FUNCTION PANEL
ALT: 33 U/L (ref 0–53)
AST: 23 U/L (ref 0–37)
Albumin: 4.4 g/dL (ref 3.5–5.2)
Alkaline Phosphatase: 50 U/L (ref 39–117)
Bilirubin, Direct: 0.1 mg/dL (ref 0.0–0.3)
Total Bilirubin: 0.6 mg/dL (ref 0.2–1.2)
Total Protein: 6.8 g/dL (ref 6.0–8.3)

## 2019-10-04 LAB — PSA: PSA: 0.76 ng/mL (ref 0.10–4.00)

## 2019-10-04 MED ORDER — BUDESONIDE-FORMOTEROL FUMARATE 160-4.5 MCG/ACT IN AERO: INHALATION_SPRAY | RESPIRATORY_TRACT | 3 refills | Status: DC

## 2019-10-04 NOTE — Telephone Encounter (Signed)
Tanda Rockers, MD sent to Rosana Berger, [place]  Call patient : Studies are unremarkable except LDL trending up - good news is that HDL also staying high so just needs diet/ ex for now - no other recs    Spoke with pt and notified of results per Dr. Melvyn Novas. Pt verbalized understanding and denied any questions.

## 2019-10-04 NOTE — Progress Notes (Signed)
Spoke with pt and notified of results per Dr. [name]. Pt verbalized understanding and denied any questions. 

## 2019-10-04 NOTE — Progress Notes (Signed)
Subjective:    Patient ID: Jose Crane, male    DOB: Aug 25, 1965    MRN: [id number]    Brief patient profile:  38  yowm never smoker with a history of childhood asthma & status asthmaticus requiring mechanical ventilation 4/97 at [facility].  He typically uses Symbicort, more on an as-needed basis rather than every day. He has only needed albuterol  rarely    History of Present Illness  09/13/2015  f/u ov/Talyn Dessert re:  Chronic asthma on symbicort 160 2 each am/very rare saba  Chief Complaint  Patient presents with  . Follow-up    Pt c/o "sensation" on the left side of his neck x 2 months.   area on neck is actually just under the mandible where he had recent dental surgery and denies ongoing dental concerns, pain, fever or chewing issues. No assoc ear ache or sinus problems  rec No change in recs   12/18/2015  f/u ov/Saylor Sheckler re:  Chronic asthma/ no flares since last ov  Chief Complaint  Patient presents with  . Follow-up    Pt states still having discomfort in neck on the left side. No new co's today.   sometimes does not have the neck discomfort and never wakes at night with it Present daily though since tooth work  avg symbicort 2 /4   Maybe saba once a week / ex tol good / sleeping ok  Not limited by breathing from desired activities   rec Plan A = Automatic = Symbicort 160 Take 2 puffs first thing in am and then another 2 puffs about 12 hours later.  Plan B = Backup Only use your albuterol as a rescue medication   CPX due 04/2016    PC 01/26/16 new L calf pain > venous doppler Pos sub phlebitis   02/01/2016 acute extended ov/Analynn Daum re: recurrent L sup phebitis  Chief Complaint  Patient presents with  . Acute Visit    Here to discuss venous u/s results. He is still having left leg pain, but this has improved some.    some better p aleve/ elevation/ heat  Breathing some worse with cough / congestion no purulent or bloody sputum  rec Please see patient coordinator before you leave  today  to schedule evaluation by vascular surgery asap (w/in next week or two at most) If breathing or leg get worse > Prednisone 10 mg take  4 each am x 2 days,   2 each am x 2 days,  1 each am x 2 days and stop  Schedule cpx before end of year > did not do    11/14/2017  f/u ov/Kimi Kroft re: re-establish re  chronic asthma/ chronic dvt On L / hbp / non-adherence  Chief Complaint  Patient presents with  . Follow-up    Appetite not doing as well recently. His BP is elevated today.   Dyspnea:  Not limited by breathing from desired activities   Cough: no SABA use: once a month 02: none  rec Bisoprolol 5 mg daily  Please see patient coordinator before you leave today  to schedule colonoscopy     01/05/2018  f/u ov/Cliff Damiani re: dtcasthma/ admits to non-adherence  Chief Complaint  Patient presents with  . Follow-up    1 month follow up. MW is his primary doctor.   Dyspnea:  A little walking / has elipitical not using / steps ok/ plays baseball Cough: no Sleeping: ok one pillow flat bed SABA use: once or twice a month 02:  no rec No change in medications though I recommend you take your aspirin and bisoprolol in am with bfast Ok to break the bisoprolol in half if you are sluggish and just take a half daily     06/30/2019  Acute  ov/Halen Mossbarger re:  Chronic asthma/ not compliant with symb or bisoprolol  Chief Complaint  Patient presents with  . Acute Visit    Left side neck discomfort x 2 wks- constant and also has some sore throat.   Dyspnea:  Not limited by breathing from desired activities  / did treadmill x 25 min x 3.7 mph Cough: none Sleeping: ok flat SABA use: zero 02: none St without assoc ear/ sinus complaints no fever  rec Brush teeth and tongue with arm and hammer toothpaste and make a slurry and gargle with it after using your symbicort  If throat gets worse call for ent referral > [facility] CT neck 08/12/19 1. No explanation for pain. No abnormal  calcification of the stylohyoid ligaments. 2. Segmental or subsegmental micro nodularity in the right upper lobe, likely small airway impaction. Rec:   ppi no better  " always a 2/10"     10/04/2019  f/u ov/Ardelle Haliburton re: chronic asthma / h/o nonadherence/ L neck pain eval by ent/ ct neg Dyspnea:  Not limited by breathing from desired activities  / walking the dog/ men's tennis  Cough: none  Sleeping: able to lie flat  SABA use: none  02: none     No obvious day to day or daytime variability or assoc excess/ purulent sputum or mucus plugs or hemoptysis or cp or chest tightness, subjective wheeze or overt sinus or hb symptoms.   Sleeping  without nocturnal  or early am exacerbation  of respiratory  c/o's or need for noct saba. Also denies any obvious fluctuation of symptoms with weather or environmental changes or other aggravating or alleviating factors except as outlined above   No unusual exposure hx or h/o childhood pna  or knowledge of premature birth.  Current Allergies, Complete Past Medical History, Past Surgical History, Family History, and Social History were reviewed in [employer] record.  ROS  The following are not active complaints unless bolded Hoarseness, sore throat, dysphagia, dental problems, itching, sneezing,  nasal congestion or discharge of excess mucus or purulent secretions, ear ache,   fever, chills, sweats, unintended wt loss or wt gain, classically pleuritic or exertional cp,  orthopnea pnd or arm/hand swelling  or leg swelling, presyncope, palpitations, abdominal pain, anorexia, nausea, vomiting, diarrhea  or change in bowel habits or change in bladder habits, change in stools or change in urine, dysuria, hematuria,  rash, arthralgias, visual complaints, headache, numbness, weakness or ataxia or problems with walking or coordination,  change in mood or  memory.        Current Meds  Medication Sig  . albuterol (PROAIR HFA) 108 (90 Base) MCG/ACT  inhaler INHALE 2 PUFFS INTO THE LUNGS EVERY 6 HOURS AS NEEDED FOR WHEEZING OR SHORTNESS OF BREATH  . aspirin 325 MG tablet Take 325 mg by mouth daily.  . budesonide-formoterol (SYMBICORT) 160-4.5 MCG/ACT inhaler INHALE 2 PUFFS FIRST THING IN THE MORNING THEN ANOTHER 2 PUFFS ABOUT 12 HOURS LATER             Past Medical History:  HYPERLIPIDEMIA (ICD-272.4)  ASTHMA (ICD-493.90)  HEALTH MAINTENANCE .............................. Nijah Orlich - Pneumovax 02/2004 (second shot) and prevnar 13 11/24/2013  - Tdap 05/20/11 - CPX  01/05/2018  Ruptured achilles April 2010  DVT Left leg sp achilles surgery July 2010  - Repeat doppler 04/10/09: Extensive chronic L Leg DVT  - repeat doppler 10/11/09---resolution of DVT in CFV and SFV, residual clot in popliteal and peroneal veins > f/u [name]      Objective:   Physical Exam  10/04/2019  205  06/30/2019  203  01/05/2018  201 Wt 209 March 14, 2008 > 216 May 15, 2009 >  > 222 November 07, 2009 > 221 05/20/2011 > 11/04/2012 196 > 05/28/2013  204 > 11/24/2013  206 > 09/13/2015  199 > 12/18/2015  208 >          amb pleasant wm nad    Vital signs reviewed  10/04/2019  - Note at rest 02 sats   % on RA     HEENT : pt wearing mask not removed for exam due to covid -19 concerns.    NECK :  without JVD/Nodes/TM/ nl carotid upstrokes bilaterally   LUNGS: no acc muscle use,  Nl contour chest which is clear to A and P bilaterally without cough on insp or exp maneuvers   CV:  RRR  no s3 or murmur or increase in P2, and no edema   ABD:  soft and nontender with nl inspiratory excursion in the supine position. No bruits or organomegaly appreciated, bowel sounds nl  MS:  Nl gait/ ext warm without deformities, calf tenderness, cyanosis or clubbing No obvious joint restrictions   SKIN: warm and dry without lesions    NEURO:  alert, approp, nl sensorium with  no motor or cerebellar deficits apparent.   GU : testes down, no nodules, Neg IH  Rectal:  Mild bph, stool G  neg/ no masses  Labs ordered/ reviewed:      Chemistry      Component Value Date/Time   NA 134 (L) 10/04/2019 1000   K 4.0 10/04/2019 1000   CL 101 10/04/2019 1000   CO2 28 10/04/2019 1000   BUN 23 10/04/2019 1000   CREATININE 1.31 10/04/2019 1000      Component Value Date/Time   CALCIUM 9.4 10/04/2019 1000   ALKPHOS 50 10/04/2019 1000   AST 23 10/04/2019 1000   ALT 33 10/04/2019 1000   BILITOT 0.6 10/04/2019 1000        Lab Results  Component Value Date   WBC 6.1 10/04/2019   HGB 15.7 10/04/2019   HCT 46.2 10/04/2019   MCV 88.2 10/04/2019   PLT 183.0 10/04/2019     No results found for: DDIMER    Lab Results  Component Value Date   TSH 1.43 10/04/2019             Lab Results  Component Value Date   CHOL 229 (H) 10/04/2019   HDL 56.90 10/04/2019   LDLCALC 148 (H) 10/04/2019   LDLDIRECT 165.9 05/28/2013   TRIG 123.0 10/04/2019   CHOLHDL 4 10/04/2019                  Assessment & Plan:

## 2019-10-04 NOTE — Patient Instructions (Signed)
No change in medications  Please remember to go to the lab department   for your tests - we will call you with the results when they are available.    Please schedule a follow up visit in 6 months but call sooner if needed  

## 2019-10-04 NOTE — Progress Notes (Signed)
LMTCB

## 2019-10-06 ENCOUNTER — Telehealth: Payer: Self-pay | Admitting: Internal Medicine

## 2019-10-06 NOTE — Telephone Encounter (Signed)
Spoke with pt, he states [employer] will not pay for the generic symbicort and wanted to know the name of the generic brand. He is going to call over to [facility] to see how much it is to be filled there and get back with [place]. I advised him that his insurance company may not pay for generic but he could try. He doesn't want to change symbicort because it has controlled his asthma for years. Will await a return call from pt.

## 2019-10-07 ENCOUNTER — Encounter: Payer: Self-pay | Admitting: Internal Medicine

## 2019-10-07 NOTE — Assessment & Plan Note (Signed)
Onset in childhood/ req mech vent 1997  09/03/2010 > decreased symbicort 80/4.83mg  -  11/04/2012  > increased symbicort to 160 bid as not consistent with use  - 11/14/2017  After extensive coaching inhaler device  effectiveness =    90% > rec continue symb 160 2bid  - Spirometry 01/05/2018  FEV1 4.5 (111%)  Ratio 90 p no rx prior  - Allergy profile 01/05/18  >  Eos 0.5 /  IgE  598 RAST pos everything but mold  All goals of chronic asthma control met including optimal function and elimination of symptoms with minimal need for rescue therapy.  Contingencies discussed in full including contacting this office immediately if not controlling the symptoms using the rule of two's.

## 2019-10-07 NOTE — Assessment & Plan Note (Signed)
Goal LDL < 130 as no fm hx known  ldl above target though high hdl probably protective so no change rx/ rec more aerobic ex and less fatty foods.

## 2019-10-07 NOTE — Assessment & Plan Note (Signed)
Up to date

## 2019-10-07 NOTE — Assessment & Plan Note (Signed)
Lab Results  Component Value Date   CREATININE 1.31 10/04/2019   CREATININE 1.30 01/05/2018   CREATININE 1.2 05/28/2013    Adequate control on present rx, reviewed in detail with pt > no change in rx needed

## 2019-12-17 ENCOUNTER — Telehealth: Payer: Self-pay | Admitting: Internal Medicine

## 2019-12-17 NOTE — Telephone Encounter (Signed)
Probably should go to urgent care as it may take weeks to get him appt with vascular surgery though ok to try get appt  - other option is just see if anyone has opening and  can do venous doppler this pm

## 2019-12-17 NOTE — Telephone Encounter (Signed)
Called and spoke with patient. He stated that he did not want to go to a UC or ED due to [place]. He wanted to know if we could call [facility] to get him an appointment on Monday. I advised him that since it had been over 3 years, he will probably be considered to be a new patient.    Attempted to call [facility] but I was hold for over 10 minutes. Will attempt to call back.

## 2019-12-17 NOTE — Telephone Encounter (Signed)
Called and spoke with patient. He stated that he is concerned about having another blood clot in his left leg. For the past 3 days, he has noticed a red spot on his left calf. The spot is warm to touch, slightly swollen. He also stated that it could be a bug bite but he has a history of blood clots. Denied any SOB or inability to put pressure or weight on the leg.   He was referred to [facility] back in 2017 and wonders if he needs to make an appt there.   MW, please advise. Thanks!

## 2019-12-29 NOTE — Telephone Encounter (Signed)
ATC patient to see if he was still having this issue or if he was able to get in with [facility]  [facility] asking for an update

## 2020-01-08 NOTE — Telephone Encounter (Signed)
Called and spoke with pt to see if he had been able to get an appt scheduled with [facility] and he stated he decided not to get an appt scheduled. Nothing further needed.

## 2020-10-04 ENCOUNTER — Emergency Department (HOSPITAL_BASED_OUTPATIENT_CLINIC_OR_DEPARTMENT_OTHER)
Admission: EM | Admit: 2020-10-04 | Discharge: 2020-10-05 | Disposition: A | Discharge status: Home / Self Care | Payer: BC Managed Care – PPO | Attending: Emergency Medicine | Admitting: Emergency Medicine

## 2020-10-04 ENCOUNTER — Emergency Department (HOSPITAL_BASED_OUTPATIENT_CLINIC_OR_DEPARTMENT_OTHER): Payer: BC Managed Care – PPO

## 2020-10-04 ENCOUNTER — Emergency Department (HOSPITAL_COMMUNITY): Payer: BC Managed Care – PPO

## 2020-10-04 ENCOUNTER — Other Ambulatory Visit: Payer: Self-pay

## 2020-10-04 ENCOUNTER — Encounter (HOSPITAL_BASED_OUTPATIENT_CLINIC_OR_DEPARTMENT_OTHER): Payer: Self-pay | Admitting: *Deleted

## 2020-10-04 DIAGNOSIS — R112 Nausea with vomiting, unspecified: Secondary | ICD-10-CM | POA: Diagnosis not present

## 2020-10-04 DIAGNOSIS — Z7982 Long term (current) use of aspirin: Secondary | ICD-10-CM | POA: Diagnosis not present

## 2020-10-04 DIAGNOSIS — Z20822 Contact with and (suspected) exposure to covid-19: Secondary | ICD-10-CM | POA: Diagnosis not present

## 2020-10-04 DIAGNOSIS — J45909 Unspecified asthma, uncomplicated: Secondary | ICD-10-CM | POA: Diagnosis not present

## 2020-10-04 DIAGNOSIS — I10 Essential (primary) hypertension: Secondary | ICD-10-CM | POA: Diagnosis not present

## 2020-10-04 DIAGNOSIS — R42 Dizziness and giddiness: Secondary | ICD-10-CM | POA: Diagnosis not present

## 2020-10-04 LAB — COMPREHENSIVE METABOLIC PANEL
ALT: 30 U/L (ref 0–44)
AST: 20 U/L (ref 15–41)
Albumin: 4.4 g/dL (ref 3.5–5.0)
Alkaline Phosphatase: 58 U/L (ref 38–126)
Anion gap: 15 (ref 5–15)
BUN: 16 mg/dL (ref 6–20)
CO2: 21 mmol/L — ABNORMAL LOW (ref 22–32)
Calcium: 9.3 mg/dL (ref 8.9–10.3)
Chloride: 101 mmol/L (ref 98–111)
Creatinine, Ser: 1.12 mg/dL (ref 0.61–1.24)
GFR, Estimated: >60 mL/min (ref >60)
Glucose, Bld: 150 mg/dL — ABNORMAL HIGH (ref 70–99)
Potassium: 4 mmol/L (ref 3.5–5.1)
Sodium: 137 mmol/L (ref 135–145)
Total Bilirubin: 0.8 mg/dL (ref 0.3–1.2)
Total Protein: 7.1 g/dL (ref 6.5–8.1)

## 2020-10-04 LAB — CBC WITH DIFFERENTIAL/PLATELET
Abs Immature Granulocytes: 0.03 10*3/uL (ref 0.00–0.07)
Basophils Absolute: 0 10*3/uL (ref 0.0–0.1)
Basophils Relative: 0 %
Eosinophils Absolute: 0 10*3/uL (ref 0.0–0.5)
Eosinophils Relative: 0 %
HCT: 48.7 % (ref 39.0–52.0)
Hemoglobin: 16.6 g/dL (ref 13.0–17.0)
Immature Granulocytes: 0 %
Lymphocytes Relative: 9 %
Lymphs Abs: 0.7 10*3/uL (ref 0.7–4.0)
MCH: 29.5 pg (ref 26.0–34.0)
MCHC: 34.1 g/dL (ref 30.0–36.0)
MCV: 86.7 fL (ref 80.0–100.0)
Monocytes Absolute: 0.2 10*3/uL (ref 0.1–1.0)
Monocytes Relative: 2 %
Neutro Abs: 7.2 10*3/uL (ref 1.7–7.7)
Neutrophils Relative %: 89 %
Platelets: 203 10*3/uL (ref 150–400)
RBC: 5.62 MIL/uL (ref 4.22–5.81)
RDW: 12.7 % (ref 11.5–15.5)
WBC: 8.1 10*3/uL (ref 4.0–10.5)
nRBC: 0 % (ref 0.0–0.2)

## 2020-10-04 LAB — RESP PANEL BY RT-PCR (FLU A&B, COVID) ARPGX2
Influenza A by PCR: NEGATIVE
Influenza B by PCR: NEGATIVE
SARS Coronavirus 2 by RT PCR: NEGATIVE

## 2020-10-04 LAB — LIPASE, BLOOD: Lipase: 31 U/L (ref 11–51)

## 2020-10-04 MED ORDER — ONDANSETRON 4 MG PO TBDP: 4.0000 mg | ORAL_TABLET | Freq: Three times a day (TID) | ORAL | 0 refills | Status: AC | PRN

## 2020-10-04 MED ORDER — MECLIZINE HCL 25 MG PO TABS: 25.0000 mg | ORAL_TABLET | Freq: Three times a day (TID) | ORAL | 0 refills | Status: AC | PRN

## 2020-10-04 MED ORDER — SODIUM CHLORIDE 0.9 % IV BOLUS
1000.0000 mL | Freq: Once | INTRAVENOUS | Status: AC
  Administered 2020-10-04: 1000 mL via INTRAVENOUS

## 2020-10-04 MED ORDER — PROCHLORPERAZINE EDISYLATE 10 MG/2ML IJ SOLN
5.0000 mg | Freq: Once | INTRAMUSCULAR | Status: AC
  Administered 2020-10-04: 5 mg via INTRAVENOUS
  Filled 2020-10-04: qty 2

## 2020-10-04 MED ORDER — ONDANSETRON HCL 4 MG/2ML IJ SOLN
4.0000 mg | Freq: Once | INTRAMUSCULAR | Status: AC
  Administered 2020-10-04: 4 mg via INTRAVENOUS
  Filled 2020-10-04: qty 2

## 2020-10-04 MED ORDER — MECLIZINE HCL 25 MG PO TABS
25.0000 mg | ORAL_TABLET | Freq: Once | ORAL | Status: AC
  Administered 2020-10-04: 25 mg via ORAL
  Filled 2020-10-04: qty 1

## 2020-10-04 MED ORDER — DIAZEPAM 5 MG/ML IJ SOLN
5.0000 mg | Freq: Once | INTRAMUSCULAR | Status: AC
  Administered 2020-10-04: 5 mg via INTRAVENOUS
  Filled 2020-10-04: qty 2

## 2020-10-04 MED ORDER — PROCHLORPERAZINE EDISYLATE 10 MG/2ML IJ SOLN
10.0000 mg | Freq: Once | INTRAMUSCULAR | Status: AC
  Administered 2020-10-04: 10 mg via INTRAVENOUS
  Filled 2020-10-04: qty 2

## 2020-10-04 NOTE — ED Triage Notes (Addendum)
Vomiting and dizziness since yesterday.  Had covid 2 weeks ago.

## 2020-10-04 NOTE — ED Notes (Signed)
PT IS NOW A HIGH FALL RISK CANDIDATE, SR X 2 UP, CALL BELL WITHIN REACH, FAMILY AT BEDSIDE, ON CONT CARDIAC MONITORING.

## 2020-10-04 NOTE — ED Notes (Signed)
ED Provider at bedside. 

## 2020-10-04 NOTE — ED Notes (Signed)
Covid Swab obtained and to the lab, staff in lab informed being done for tx/ adm purposes

## 2020-10-04 NOTE — ED Notes (Signed)
States he felt bad on Monday, has progressively felt worse, vomiting began on Monday as well. Poor appetite, no POs today. Appears sick.

## 2020-10-04 NOTE — ED Provider Notes (Signed)
Sent for MRI to rule out stroke for his dizziness. MRI negative. Will treat as vertigo.  The patient appears reasonably screened and/or stabilized for discharge and I doubt any other medical condition or other [facility] requiring further screening, evaluation, or treatment in the ED at this time prior to discharge. Safe for discharge with strict return precautions.  Disposition: Discharge  Condition: Good  I have discussed the results, Dx and Tx plan with the patient/family who expressed understanding and agree(s) with the plan. Discharge instructions discussed at length. The patient/family was given strict return precautions who verbalized understanding of the instructions. No further questions at time of discharge.    ED Discharge Orders         Ordered    meclizine (ANTIVERT) 25 MG tablet  3 times daily PRN        10/04/20 2345    ondansetron (ZOFRAN ODT) 4 MG disintegrating tablet  Every 8 hours PRN        10/04/20 2345           Follow Up: Tanda Rockers, MD [address] [place] [address] [phone]  Call  to schedule an appointment for close follow up      Fatima Blank, MD 10/04/20 [phone]

## 2020-10-04 NOTE — ED Notes (Signed)
BEFAST ASSESSMENT NEGATIVE 

## 2020-10-04 NOTE — ED Notes (Signed)
[facility] Transport Team at bedside 

## 2020-10-04 NOTE — ED Notes (Signed)
Phone Handoff Report provided to [employer] - Christy-RN

## 2020-10-04 NOTE — ED Notes (Signed)
Attempted to ambulate patient. Patient unsteady on his feet and diaphoretic with minimal ambulation. Patient appears as if he will fall off toilet. Patient assisted back to room with steady. Patient reports nausea.

## 2020-10-04 NOTE — ED Provider Notes (Signed)
[facility] EMERGENCY DEPT Provider Note   CSN: [id number] Arrival date & time: 10/04/20  1440     History Chief Complaint  Patient presents with  . Emesis  . Dizziness    Jose Crane is a 55 y.o. male.  The history is provided by the patient.  Dizziness Quality:  Vertigo Severity:  Moderate Onset quality:  Gradual Duration:  4 days Timing:  Intermittent Progression:  Unchanged Chronicity:  Recurrent Context: head movement   Relieved by:  Being still Worsened by:  Sitting upright Associated symptoms: nausea and vomiting   Associated symptoms: no blood in stool, no chest pain, no diarrhea, no headaches, no hearing loss, no palpitations, no shortness of breath, no tinnitus and no weakness   Risk factors: hx of vertigo        Past Medical History:  Diagnosis Date  . Asthma   . Clotting disorder ([place])   . DVT (deep venous thrombosis) ([place]) 10-2008   Left leg; after achilles surgery; Repeat doppler 04-10-2009: Extensive chronic L leg DVT. Repeat Doppler 10-11-2009--resolution of DVT in CFV and SFV, residual clot in popliteal and peroneal veins  . Hyperlipidemia   . Hypertension   . Ruptured, tendon, Achilles 07-2008    Patient Active Problem List   Diagnosis Date Noted  . Acute sore throat 06/30/2019  . Varicose veins of bilateral lower extremities with other complications 09/73/5329  . Anterior cervical adenopathy 09/15/2015  . Health care maintenance 05/28/2013  . Contact dermatitis 04/19/2011  . INJURY, LEG 10/10/2009  . POSTURAL LIGHTHEADEDNESS 05/15/2009  . HYPERTENSION, BENIGN 04/14/2009  . Acute thromboembolism of deep veins of lower extremity ([place]) 03/21/2009  . INFLUENZA, WITH RESPIRATORY SYMPTOMS 03/11/2008  . Hyperlipidemia LDL goal <130 02/10/2008  . Moderate persistent chronic asthma without complication 92/42/6834    Past Surgical History:  Procedure Laterality Date  . ACHILLES TENDON REPAIR Bilateral    2003 right, left  2010  . FOOT SURGERY Bilateral    2003 - right, 2010 - left       Family History  Adopted: Yes    Social History   Tobacco Use  . Smoking status: Never Smoker  . Smokeless tobacco: Never Used  Vaping Use  . Vaping Use: Never used  Substance Use Topics  . Alcohol use: Yes    Alcohol/week: 4.0 - 5.0 standard drinks    Types: 4 - 5 Standard drinks or equivalent per week  . Drug use: Yes    Types: Marijuana    Comment: 2 days ago    Home Medications Prior to Admission medications   Medication Sig Start Date End Date Taking? Authorizing Provider  aspirin 325 MG tablet Take 325 mg by mouth daily.   Yes [provider]  albuterol (PROAIR HFA) 108 (90 Base) MCG/ACT inhaler INHALE 2 PUFFS INTO THE LUNGS EVERY 6 HOURS AS NEEDED FOR WHEEZING OR SHORTNESS OF BREATH 12/13/16   Tanda Rockers, MD  budesonide-formoterol (SYMBICORT) 160-4.5 MCG/ACT inhaler INHALE 2 PUFFS FIRST THING IN THE MORNING THEN ANOTHER 2 PUFFS ABOUT 12 HOURS LATER 10/04/19   Tanda Rockers, MD    Allergies    Patient has no known allergies.  Review of Systems   Review of Systems  Constitutional: Negative for chills and fever.  HENT: Negative for ear pain, hearing loss, sore throat and tinnitus.   Eyes: Negative for pain and visual disturbance.  Respiratory: Negative for cough and shortness of breath.   Cardiovascular: Negative for chest pain and palpitations.  Gastrointestinal: Positive for nausea and vomiting. Negative for abdominal pain, blood in stool and diarrhea.  Genitourinary: Negative for dysuria and hematuria.  Musculoskeletal: Negative for arthralgias and back pain.  Skin: Negative for color change and rash.  Neurological: Positive for dizziness. Negative for tremors, seizures, syncope, facial asymmetry, speech difficulty, weakness, light-headedness, numbness and headaches.  All other systems reviewed and are negative.   Physical Exam Updated Vital Signs BP (!) 144/96 (BP Location:  Left Arm)   Pulse (!) 58   Temp 98.1 F (36.7 C) (Oral)   Resp 16   Ht 5\' 11"  (1.803 m)   Wt 95.3 kg   SpO2 97%   BMI 29.29 kg/m   Physical Exam Vitals and nursing note reviewed.  Constitutional:      General: He is not in acute distress.    Appearance: He is well-developed. He is not ill-appearing.  HENT:     Head: Normocephalic and atraumatic.     Right Ear: Tympanic membrane normal.     Left Ear: Tympanic membrane normal.     Nose: Nose normal.     Mouth/Throat:     Mouth: Mucous membranes are moist.  Eyes:     Extraocular Movements: Extraocular movements intact.     Conjunctiva/sclera: Conjunctivae normal.     Pupils: Pupils are equal, round, and reactive to light.  Cardiovascular:     Rate and Rhythm: Normal rate and regular rhythm.     Pulses: Normal pulses.     Heart sounds: Normal heart sounds. No murmur heard.   Pulmonary:     Effort: Pulmonary effort is normal. No respiratory distress.     Breath sounds: Normal breath sounds.  Abdominal:     Palpations: Abdomen is soft.     Tenderness: There is no abdominal tenderness.  Musculoskeletal:        General: Normal range of motion.     Cervical back: Normal range of motion and neck supple.  Skin:    General: Skin is warm and dry.     Capillary Refill: Capillary refill takes less than 2 seconds.  Neurological:     General: No focal deficit present.     Mental Status: He is alert and oriented to person, place, and time.     Cranial Nerves: No cranial nerve deficit.     Sensory: No sensory deficit.     Motor: No weakness.     Coordination: Coordination normal.     Comments: 5+ out of 5 strength throughout, normal sensation, normal finger-nose-finger, normal heel-to-shin bilaterally     ED Results / Procedures / Treatments   Labs (all labs ordered are listed, but only abnormal results are displayed) Labs Reviewed  COMPREHENSIVE METABOLIC PANEL - Abnormal; Notable for the following components:      Result  Value   CO2 21 (*)    Glucose, Bld 150 (*)    All other components within normal limits  CBC WITH DIFFERENTIAL/PLATELET  LIPASE, BLOOD    EKG EKG Interpretation  Date/Time:  Wednesday October 04 2020 15:40:48 EDT Ventricular Rate:  61 PR Interval:  194 QRS Duration: 103 QT Interval:  451 QTC Calculation: 455 R Axis:   102 Text Interpretation: Sinus rhythm Consider left atrial enlargement Right axis deviation Confirmed by Lennice Sites ([id number]) on 10/04/2020 3:53:22 PM   Radiology CT Head Wo Contrast  Result Date: 10/04/2020 CLINICAL DATA:  Dizziness. EXAM: CT HEAD WITHOUT CONTRAST TECHNIQUE: Contiguous axial images were obtained from the base of the skull  through the vertex without intravenous contrast. COMPARISON:  None. FINDINGS: Brain: No intracranial hemorrhage, mass effect, or midline shift. No hydrocephalus. The basilar cisterns are patent. No evidence of territorial infarct or acute ischemia. No extra-axial or intracranial fluid collection. Vascular: No hyperdense vessel or unexpected calcification. Skull: Normal. Negative for fracture or focal lesion. Sinuses/Orbits: Paranasal sinuses and mastoid air cells are clear. The visualized orbits are unremarkable. Other: None. IMPRESSION: Negative noncontrast head CT. Electronically Signed   By: Keith Rake M.D.   On: 10/04/2020 16:31    Procedures Procedures   Medications Ordered in ED Medications  sodium chloride 0.9 % bolus 1,000 mL (has no administration in time range)  prochlorperazine (COMPAZINE) injection 10 mg (has no administration in time range)  sodium chloride 0.9 % bolus 1,000 mL (0 mLs Intravenous Stopped 10/04/20 1659)  ondansetron (ZOFRAN) injection 4 mg (4 mg Intravenous Given 10/04/20 1528)  diazepam (VALIUM) injection 5 mg (5 mg Intravenous Given 10/04/20 1540)  meclizine (ANTIVERT) tablet 25 mg (25 mg Oral Given 10/04/20 1704)    ED Course  I have reviewed the triage vital signs and the nursing notes.  Pertinent  labs & imaging results that were available during my care of the patient were reviewed by me and considered in my medical decision making (see chart for details).    MDM Rules/Calculators/A&P                          Jose Crane is a 55 year old male with history of vertigo, DVT in the setting of provoked process after Achilles injury who presents the ED with dizziness and vomiting.  Feels like his vertigo that he had a long time ago.  On and off for the last 4 days.  Positional.  Comes with waves of intense nausea and vomiting.  Gets better when he lies still.  He is fairly symptomatic.  Neurological exam is normal but unable to ambulate him as he gets intensely dizzy with change in his of position.  We will give him a dose of Valium, Zofran, fluid bolus, basic labs, head CT and reevaluate him.  Have a low suspicion for stroke at this time however if he still very symptomatic may need an MRI.  430: lab work and imaging unremarkable.  Reevaluated at this time and feeling better but will let IV fluids finish and allow him to rest longer and reevaluate.  Still suspect that he has a peripheral vertigo.  5:45 PM patient feeling slightly better.  He is able to sit up with minimal symptoms but when he ambulates he is very symptomatic.  Talked with Dr. Almyra Free at [facility] as I would like to transfer him to get MRI to rule out stroke.  Will give additional fluid bolus, IV Compazine, meclizine to further treat.  Suspect symptomatic vertigo versus stroke.  Does not have any chest pain, weakness, headache.  No concern for other infectious process or cardiac process.   This chart was dictated using voice recognition software.  Despite best efforts to proofread,  errors can occur which can change the documentation meaning.   Final Clinical Impression(s) / ED Diagnoses Final diagnoses:  Dizziness    Rx / DC Orders ED Discharge Orders    None       Lennice Sites, DO 10/04/20 1745

## 2020-10-09 ENCOUNTER — Encounter: Payer: Self-pay | Admitting: Adult Health

## 2020-10-09 ENCOUNTER — Other Ambulatory Visit: Payer: Self-pay

## 2020-10-09 ENCOUNTER — Ambulatory Visit (INDEPENDENT_AMBULATORY_CARE_PROVIDER_SITE_OTHER): Payer: BC Managed Care – PPO | Admitting: Adult Health

## 2020-10-09 DIAGNOSIS — I10 Essential (primary) hypertension: Secondary | ICD-10-CM

## 2020-10-09 DIAGNOSIS — J4540 Moderate persistent asthma, uncomplicated: Secondary | ICD-10-CM

## 2020-10-09 DIAGNOSIS — H8110 Benign paroxysmal vertigo, unspecified ear: Secondary | ICD-10-CM | POA: Insufficient documentation

## 2020-10-09 MED ORDER — BUDESONIDE-FORMOTEROL FUMARATE 160-4.5 MCG/ACT IN AERO: INHALATION_SPRAY | RESPIRATORY_TRACT | 3 refills | Status: DC

## 2020-10-09 MED ORDER — LOSARTAN POTASSIUM 25 MG PO TABS: 25.0000 mg | ORAL_TABLET | Freq: Every day | ORAL | 5 refills | Status: DC

## 2020-10-09 MED ORDER — ALBUTEROL SULFATE HFA 108 (90 BASE) MCG/ACT IN AERS: INHALATION_SPRAY | RESPIRATORY_TRACT | 1 refills | Status: DC

## 2020-10-09 NOTE — Progress Notes (Addendum)
_0  ID: Jose Crane, male    DOB: 1965/09/08, 55 y.o.   MRN: [id number]  Chief Complaint  Patient presents with   Follow-up    Referring provider: Tanda Rockers, MD  HPI: 55 year old male never smoker primary care patient of Dr. Marlene Lard followed for asthma  TEST/EVENTS :   10/09/2020 Follow up : Dizziness, ER follow up  Patient returns for a follow-up from recent emergency room visit.  Patient complains that 1 week ago he developed some intermittent nausea and vomiting.  Symptoms continue to progressively worsen and he threw up for over 12 hours straight.  Went to the emergency room on October 04, 2020 with severe nausea vomiting, severe dizziness and weakness.  ER notes were reviewed in detail prior to emergency room visit patient had COVID-19 infection about 2 to 3 weeks before this.  Said he had cold-like symptoms.  In the emergency room patient underwent CT head and MRI brain that were negative for acute process.  Lab work was unrevealing.  Patient was given IV fluids, Valium, Zofran.  Patient was prescribed meclizine.  Patient was felt to have some underlying vertigo.  Patient had severe dizziness with any type of movement.  Patient says it has gotten some better but has not resolved when he stands up or changes position and turns over he has lightheadedness.  Even at rest he has some what he describes as fuzzy feeling in his brain.  Patient has not been able to drive.  Has minimal headache.  Has some sinus congestion and drainage .No loss of vision , arm weakness speech changes palpitations chest pain or syncope.  Patient says he has not been drinking enough fluids.  He denies any alcohol use.  He has been using meclizine 3 times a day. Patient has borderline blood pressure issues in the past.  Patient says his blood pressure was elevated in the emergency room.  Blood pressure is elevated today at 160/90. Patient does have asthma.  Says his asthma is been under good control.  Uses  Symbicort most days just once a day in the morning time.  He denies any flare of cough or wheezing.  No increased albuterol use.  Does need refills.  Does complain that his insurance does not give good coverage.  We discussed obtaining his formulary..   No Known Allergies  Immunization History  Administered Date(s) Administered   Influenza Whole 01/27/2009   Pneumococcal Conjugate-13 11/24/2013   Tdap 05/20/2011    Past Medical History:  Diagnosis Date   Asthma    Clotting disorder ([place])    DVT (deep venous thrombosis) ([place]) 10-2008   Left leg; after achilles surgery; Repeat doppler 04-10-2009: Extensive chronic L leg DVT. Repeat Doppler 10-11-2009--resolution of DVT in CFV and SFV, residual clot in popliteal and peroneal veins   Hyperlipidemia    Hypertension    Ruptured, tendon, Achilles 07-2008    Tobacco History: Social History   Tobacco Use  Smoking Status Never  Smokeless Tobacco Never   Counseling given: Not Answered   Outpatient Medications Prior to Visit  Medication Sig Dispense Refill   aspirin 325 MG tablet Take 325 mg by mouth daily.     meclizine (ANTIVERT) 25 MG tablet Take 1 tablet (25 mg total) by mouth 3 (three) times daily as needed for up to 15 days for dizziness. 45 tablet 0   albuterol (PROAIR HFA) 108 (90 Base) MCG/ACT inhaler INHALE 2 PUFFS INTO THE LUNGS EVERY 6 HOURS AS NEEDED FOR  WHEEZING OR SHORTNESS OF BREATH 8.5 Inhaler 1   budesonide-formoterol (SYMBICORT) 160-4.5 MCG/ACT inhaler INHALE 2 PUFFS FIRST THING IN THE MORNING THEN ANOTHER 2 PUFFS ABOUT 12 HOURS LATER 3 Inhaler 3   No facility-administered medications prior to visit.     Review of Systems:   Constitutional:   No  weight loss, night sweats,  Fevers, chills, fatigue, or  lassitude.  HEENT:   No headaches,  Difficulty swallowing,  Tooth/dental problems, or  Sore throat,                No sneezing, itching, ear ache, nasal congestion, post nasal drip,   CV:  No chest pain,   Orthopnea, PND, swelling in lower extremities, anasarca, dizziness, palpitations, syncope.   GI  No heartburn, indigestion, abdominal pain, nausea, vomiting, diarrhea, change in bowel habits, loss of appetite, bloody stools.   Resp: No shortness of breath with exertion or at rest.  No excess mucus, no productive cough,  No non-productive cough,  No coughing up of blood.  No change in color of mucus.  No wheezing.  No chest wall deformity  Skin: no rash or lesions.  GU: no dysuria, change in color of urine, no urgency or frequency.  No flank pain, no hematuria   MS:  No joint pain or swelling.  No decreased range of motion.  No back pain.    Physical Exam  BP (!) 160/90   Pulse 65   Ht 6' (1.829 m)   Wt 206 lb (93.4 kg)   SpO2 97%   BMI 27.94 kg/m   GEN: A/Ox3; pleasant , NAD, well nourished    HEENT:  [place]/AT,  EACs-clear, TMs-wnl, NOSE-clear, THROAT-clear, no lesions, no postnasal drip or exudate noted.   NECK:  Supple w/ fair ROM; no JVD; normal carotid impulses w/o bruits; no thyromegaly or nodules palpated; no lymphadenopathy.    RESP  Clear  P & A; w/o, wheezes/ rales/ or rhonchi. no accessory muscle use, no dullness to percussion  CARD:  RRR, no m/r/g, no peripheral edema, pulses intact, no cyanosis or clubbing. +varicose veins in LE   GI:   Soft & nt; nml bowel sounds; no organomegaly or masses detected.   Musco: Warm bil, no deformities or joint swelling noted.   Neuro: alert, no focal deficits noted.  PERRLA, nml grips, + dizziness with change in position , and walking .   Skin: Warm, no lesions or rashes    Lab Results:  CBC    Component Value Date/Time   WBC 8.1 10/04/2020 1530   RBC 5.62 10/04/2020 1530   HGB 16.6 10/04/2020 1530   HCT 48.7 10/04/2020 1530   PLT 203 10/04/2020 1530   MCV 86.7 10/04/2020 1530   MCH 29.5 10/04/2020 1530   MCHC 34.1 10/04/2020 1530   RDW 12.7 10/04/2020 1530   LYMPHSABS 0.7 10/04/2020 1530   MONOABS 0.2 10/04/2020  1530   EOSABS 0.0 10/04/2020 1530   BASOSABS 0.0 10/04/2020 1530    BMET    Component Value Date/Time   NA 137 10/04/2020 1530   K 4.0 10/04/2020 1530   CL 101 10/04/2020 1530   CO2 21 (L) 10/04/2020 1530   GLUCOSE 150 (H) 10/04/2020 1530   BUN 16 10/04/2020 1530   CREATININE 1.12 10/04/2020 1530   CALCIUM 9.3 10/04/2020 1530   GFRNONAA >60 10/04/2020 1530   GFRAA  08/17/2008 1145    >60        The eGFR has been calculated  using the MDRD equation. This calculation has not been validated in all clinical situations. eGFR's persistently <60 mL/min signify possible Chronic Kidney Disease.    BNP No results found for: BNP  ProBNP No results found for: PROBNP  Imaging: CT Head Wo Contrast  Result Date: 10/04/2020 CLINICAL DATA:  Dizziness. EXAM: CT HEAD WITHOUT CONTRAST TECHNIQUE: Contiguous axial images were obtained from the base of the skull through the vertex without intravenous contrast. COMPARISON:  None. FINDINGS: Brain: No intracranial hemorrhage, mass effect, or midline shift. No hydrocephalus. The basilar cisterns are patent. No evidence of territorial infarct or acute ischemia. No extra-axial or intracranial fluid collection. Vascular: No hyperdense vessel or unexpected calcification. Skull: Normal. Negative for fracture or focal lesion. Sinuses/Orbits: Paranasal sinuses and mastoid air cells are clear. The visualized orbits are unremarkable. Other: None. IMPRESSION: Negative noncontrast head CT. Electronically Signed   By: Keith Rake M.D.   On: 10/04/2020 16:31   MR Brain Wo Contrast (neuro protocol)  Result Date: 10/04/2020 CLINICAL DATA:  Initial evaluation for acute dizziness. EXAM: MRI HEAD WITHOUT CONTRAST TECHNIQUE: Multiplanar, multiecho pulse sequences of the brain and surrounding structures were obtained without intravenous contrast. COMPARISON:  Prior CT from earlier the same day. FINDINGS: Brain: Cerebral volume within normal limits for patient age. No  focal parenchymal signal abnormality identified. No abnormal foci of restricted diffusion to suggest acute or subacute ischemia. Gray-white matter differentiation well maintained. No encephalomalacia to suggest chronic infarction. No foci of susceptibility artifact to suggest acute or chronic intracranial hemorrhage. No mass lesion, midline shift or mass effect. No hydrocephalus. No extra-axial fluid collection. Pituitary gland and suprasellar region are normal. Midline structures intact and normal. Vascular: Major intracranial vascular flow voids are well maintained. Skull and upper cervical spine: Craniocervical junction within normal limits. Upper cervical spine unremarkable. Bone marrow signal intensity within normal limits. No scalp soft tissue abnormality. Sinuses/Orbits: Left gaze noted. Globes and orbital soft tissues otherwise unremarkable. Mild scattered mucosal thickening noted within the ethmoidal air cells and maxillary sinuses. Paranasal sinuses are otherwise clear. Trace bilateral mastoid effusions noted. Visualized nasopharynx within normal limits. Inner ear structures grossly normal. Other: None. IMPRESSION: Normal brain MRI. No acute intracranial abnormality identified. Electronically Signed   By: Jeannine Boga M.D.   On: 10/04/2020 21:36      No flowsheet data found.  No results found for: NITRICOXIDE      Assessment & Plan:   HYPERTENSION, BENIGN Blood pressure remains elevated.  Looking back at patient's blood pressure trends.  He does have a history of borderline hypertension.  We discussed starting medications to obtain blood pressure goals. Patient will begin losartan.  Patient education was given.  Patient is to keep a blood pressure log and return in 4 weeks.  Plan  Patient Instructions  Begin Claritin 45m daily.  Begin Losartan 237mdaily for high blood pressure. -Blood pressure goal is less than 12333/54-TGystolic blood pressure is less than 90 call our  office Please keep blood pressure log daily and bring to next visit Change positions slowly Push fluids May try propel, Gatorade, liquid IV Continue on Symbicort 2 puffs twice daily Call our office if Symbicort is not covered, please check with your insurance for your formulary Call back if your dizziness is not improving will need referral to ENT Follow-up with Dr. WeMelvyn Novasn 4 weeks and as needed  Please contact office for sooner follow up if symptoms do not improve or worsen or seek emergency care  Moderate persistent chronic asthma without complication Controlled on current regimen.  Patient is obtain his formulary so we can help with medication coverage.  Plan  Patient Instructions  Begin Claritin 104m daily.  Begin Losartan 239mdaily for high blood pressure. -Blood pressure goal is less than 12867/67-MCystolic blood pressure is less than 90 call our office Please keep blood pressure log daily and bring to next visit Change positions slowly Push fluids May try propel, Gatorade, liquid IV Continue on Symbicort 2 puffs twice daily Call our office if Symbicort is not covered, please check with your insurance for your formulary Call back if your dizziness is not improving will need referral to ENT Follow-up with Dr. WeMelvyn Novasn 4 weeks and as needed  Please contact office for sooner follow up if symptoms do not improve or worsen or seek emergency care           Vertigo, benign positional Vertigo-ongoing symptoms over the last week after severe nausea vomiting episode.  Suspect patient continues to have some ongoing hypovolemia.  Have encouraged him to push fluids.  Continue with meclizine as needed.  Change positions slowly.  If symptoms persist consider referral to ENT for further evaluation.  Emergency room work-up showed lab work was unrevealing.  CT had an MRI with no acute process and reassuring.  Exam is on revealing for acute process.  Patient does have provoked  symptoms with change of position and standing.  Plan  Patient Instructions  Begin Claritin 1076maily.  Begin Losartan 47m56mily for high blood pressure. -Blood pressure goal is less than 120/947/09-GGtolic blood pressure is less than 90 call our office Please keep blood pressure log daily and bring to next visit Change positions slowly Push fluids May try propel, Gatorade, liquid IV Continue on Symbicort 2 puffs twice daily Call our office if Symbicort is not covered, please check with your insurance for your formulary Call back if your dizziness is not improving will need referral to ENT Follow-up with Dr. WertMelvyn Novas4 weeks and as needed  Please contact office for sooner follow up if symptoms do not improve or worsen or seek emergency care          I spent  42  minutes dedicated to the care of this patient on the date of this encounter to include pre-visit review of records, face-to-face time with the patient discussing conditions above, post visit ordering of testing, clinical documentation with the electronic health record, making appropriate referrals as documented, and communicating necessary findings to members of the patients care team.      TammRexene Edison 10/09/2020

## 2020-10-09 NOTE — Assessment & Plan Note (Signed)
Controlled on current regimen.  Patient is obtain his formulary so we can help with medication coverage.  Plan  Patient Instructions  Begin Claritin 10mg  daily.  Begin Losartan 25mg  daily for high blood pressure. -Blood pressure goal is less than 390/30-SP systolic blood pressure is less than 90 call our office Please keep blood pressure log daily and bring to next visit Change positions slowly Push fluids May try propel, Gatorade, liquid IV Continue on Symbicort 2 puffs twice daily Call our office if Symbicort is not covered, please check with your insurance for your formulary Call back if your dizziness is not improving will need referral to ENT Follow-up with Dr. Melvyn Novas in 4 weeks and as needed  Please contact office for sooner follow up if symptoms do not improve or worsen or seek emergency care

## 2020-10-09 NOTE — Assessment & Plan Note (Signed)
Vertigo-ongoing symptoms over the last week after severe nausea vomiting episode.  Suspect patient continues to have some ongoing hypovolemia.  Have encouraged him to push fluids.  Continue with meclizine as needed.  Change positions slowly.  If symptoms persist consider referral to ENT for further evaluation.  Emergency room work-up showed lab work was unrevealing.  CT had an MRI with no acute process and reassuring.  Exam is on revealing for acute process.  Patient does have provoked symptoms with change of position and standing.  Plan  Patient Instructions  Begin Claritin 10mg  daily.  Begin Losartan 25mg  daily for high blood pressure. -Blood pressure goal is less than 171/27-KN systolic blood pressure is less than 90 call our office Please keep blood pressure log daily and bring to next visit Change positions slowly Push fluids May try propel, Gatorade, liquid IV Continue on Symbicort 2 puffs twice daily Call our office if Symbicort is not covered, please check with your insurance for your formulary Call back if your dizziness is not improving will need referral to ENT Follow-up with Dr. Melvyn Novas in 4 weeks and as needed  Please contact office for sooner follow up if symptoms do not improve or worsen or seek emergency care

## 2020-10-09 NOTE — Assessment & Plan Note (Signed)
Blood pressure remains elevated.  Looking back at patient's blood pressure trends.  He does have a history of borderline hypertension.  We discussed starting medications to obtain blood pressure goals. Patient will begin losartan.  Patient education was given.  Patient is to keep a blood pressure log and return in 4 weeks.  Plan  Patient Instructions  Begin Claritin 10mg  daily.  Begin Losartan 25mg  daily for high blood pressure. -Blood pressure goal is less than 460/02-BK systolic blood pressure is less than 90 call our office Please keep blood pressure log daily and bring to next visit Change positions slowly Push fluids May try propel, Gatorade, liquid IV Continue on Symbicort 2 puffs twice daily Call our office if Symbicort is not covered, please check with your insurance for your formulary Call back if your dizziness is not improving will need referral to ENT Follow-up with Dr. Melvyn Novas in 4 weeks and as needed  Please contact office for sooner follow up if symptoms do not improve or worsen or seek emergency care

## 2020-10-09 NOTE — Patient Instructions (Addendum)
Begin Claritin 10mg  daily.  Begin Losartan 25mg  daily for high blood pressure. -Blood pressure goal is less than 332/95-JO systolic blood pressure is less than 90 call our office Please keep blood pressure log daily and bring to next visit Change positions slowly Push fluids May try propel, Gatorade, liquid IV Continue on Symbicort 2 puffs twice daily Call our office if Symbicort is not covered, please check with your insurance for your formulary Call back if your dizziness is not improving will need referral to ENT Follow-up with Dr. Melvyn Novas in 4 weeks and as needed  Please contact office for sooner follow up if symptoms do not improve or worsen or seek emergency care

## 2020-11-29 ENCOUNTER — Encounter: Payer: Self-pay | Admitting: Internal Medicine

## 2020-11-29 ENCOUNTER — Other Ambulatory Visit: Payer: Self-pay

## 2020-11-29 ENCOUNTER — Ambulatory Visit (INDEPENDENT_AMBULATORY_CARE_PROVIDER_SITE_OTHER): Payer: BC Managed Care – PPO | Admitting: Internal Medicine

## 2020-11-29 DIAGNOSIS — I10 Essential (primary) hypertension: Secondary | ICD-10-CM

## 2020-11-29 DIAGNOSIS — I82402 Acute embolism and thrombosis of unspecified deep veins of left lower extremity: Secondary | ICD-10-CM

## 2020-11-29 DIAGNOSIS — J4540 Moderate persistent asthma, uncomplicated: Secondary | ICD-10-CM

## 2020-11-29 DIAGNOSIS — Z0000 Encounter for general adult medical examination without abnormal findings: Secondary | ICD-10-CM

## 2020-11-29 MED ORDER — SILDENAFIL CITRATE 50 MG PO TABS: 50.0000 mg | ORAL_TABLET | Freq: Every day | ORAL | 5 refills | Status: DC | PRN

## 2020-11-29 NOTE — Assessment & Plan Note (Signed)
DVT Left leg after achilles surgery July 2010  - Repeat doppler 04/10/09: Extensive chronic L Leg DVT  -repeat doppler 10/11/09---resolution of DVT in CFV and SFV, residual clot in popliteal and peroneal veins  -repeat doppler 09/03/2010 >>residual clot in left proximal popliteal vein. >>refer to vascular  -OV w/ Dr. Kellie Simmering 09/25/10  in Vascular w/ rec of possible trial off coumadin>>pt decided to stop coumadin      >>>Per phone conversation with pt on  02/19/2011 >>pt did not follow up with Dr. Melvyn Novas  As recommended by Dr. Kellie Simmering to discuss            Coumadin , he stopped on 11/28/10 and does not want to be on couamdin, Is taking an ASA daily. [facility] notified- [name], RN. Pt advised of risks esp with immobility and travel. Pt verbalized understanding.  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 - Positive for a superficial thrombus of the greater saphenous vein coursing from about 3 inches above the ankle to three inches below the knee. 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 - Refer back to Dr Kellie Simmering  02/01/2016 > no Change recs   Continue  asa 325 mg daily          Each maintenance medication was reviewed in detail including emphasizing most importantly the difference between maintenance and prns and under what circumstances the prns are to be triggered using an action plan format where appropriate.  Total time for H and P, chart review, counseling, reviewing hfa device(s) and generating customized AVS unique to this office visit / same day charting = 24 min

## 2020-11-29 NOTE — Assessment & Plan Note (Signed)
Onset in childhood/ req mech vent 1997  09/03/2010 > decreased symbicort 80/4.57mg  -  11/04/2012  > increased symbicort to 160 bid as not consistent with use  - 11/14/2017  After extensive coaching inhaler device  effectiveness =    90% > rec continue symb 160 2bid  - Spirometry 01/05/2018  FEV1 4.5 (111%)  Ratio 90 p no rx prior  - Allergy profile 01/05/18  >  Eos 0.5 /  IgE  598 RAST pos everything but mold  Despite documented atopy >  All goals of chronic asthma control met including optimal function and elimination of symptoms with minimal need for rescue therapy.  Contingencies discussed in full including contacting this office immediately if not controlling the symptoms using the rule of two's.     F/u can be yearly for refills or per PCP

## 2020-11-29 NOTE — Assessment & Plan Note (Signed)
Referred to Primary Care Patient/ Jose Crane  11/29/2020    Lab Results  Component Value Date   CREATININE 1.12 10/04/2020   CREATININE 1.31 10/04/2019   CREATININE 1.30 01/05/2018     F/u PCP

## 2020-11-29 NOTE — Patient Instructions (Signed)
We will call to set you up with Internal Medicine but all issues related to asthma you can call me.

## 2020-11-29 NOTE — Progress Notes (Signed)
Subjective:    Patient ID: Jose Crane, male    DOB: Apr 25, 1966    MRN: [id number]    Brief patient profile:  9  yowm never smoker with a history of childhood asthma & status asthmaticus requiring mechanical ventilation 4/97 at [facility].  He typically uses Symbicort, more on an as-needed basis rather than every day. He has only needed albuterol  rarely    History of Present Illness  09/13/2015  f/u ov/Jose Crane re:  Chronic asthma on symbicort 160 2 each am/very rare saba  Chief Complaint  Patient presents with   Follow-up    Pt c/o "sensation" on the left side of his neck x 2 months.   area on neck is actually just under the mandible where he had recent dental surgery and denies ongoing dental concerns, pain, fever or chewing issues. No assoc ear ache or sinus problems  rec No change in recs   12/18/2015  f/u ov/Jose Crane re:  Chronic asthma/ no flares since last ov  Chief Complaint  Patient presents with   Follow-up    Pt states still having discomfort in neck on the left side. No new co's today.   sometimes does not have the neck discomfort and never wakes at night with it Present daily though since tooth work  avg symbicort 2 /4   Maybe saba once a week / ex tol good / sleeping ok  Not limited by breathing from desired activities   rec Plan A = Automatic = Symbicort 160 Take 2 puffs first thing in am and then another 2 puffs about 12 hours later.  Plan B = Backup Only use your albuterol as a rescue medication   CPX due 04/2016    PC 01/26/16 new L calf pain > venous doppler Pos sub phlebitis   02/01/2016 acute extended ov/Jose Crane re: recurrent L sup phebitis  Chief Complaint  Patient presents with   Acute Visit    Here to discuss venous u/s results. He is still having left leg pain, but this has improved some.    some better p aleve/ elevation/ heat  Breathing some worse with cough / congestion no purulent or bloody sputum  rec Please see patient coordinator before you leave  today  to schedule evaluation by vascular surgery asap (w/in next week or two at most) If breathing or leg get worse > Prednisone 10 mg take  4 each am x 2 days,   2 each am x 2 days,  1 each am x 2 days and stop  Schedule cpx before end of year > did not do    11/14/2017  f/u ov/Jose Crane re: re-establish re  chronic asthma/ chronic dvt On L / hbp / non-adherence  Chief Complaint  Patient presents with   Follow-up    Appetite not doing as well recently. His BP is elevated today.   Dyspnea:  Not limited by breathing from desired activities   Cough: no SABA use: once a month 02: none  rec Bisoprolol 5 mg daily  Please see patient coordinator before you leave today  to schedule colonoscopy     01/05/2018  f/u ov/Jose Crane re: dtcasthma/ admits to non-adherence  Chief Complaint  Patient presents with   Follow-up    1 month follow up. MW is his primary doctor.   Dyspnea:  A little walking / has elipitical not using / steps ok/ plays baseball Cough: no Sleeping: ok one pillow flat bed SABA use: once or twice a month 02:  no rec No change in medications though I recommend you take your aspirin and bisoprolol in am with bfast Ok to break the bisoprolol in half if you are sluggish and just take a half daily     06/30/2019  Acute  ov/Jose Crane re:  Chronic asthma/ not compliant with symb or bisoprolol  Chief Complaint  Patient presents with   Acute Visit    Left side neck discomfort x 2 wks- constant and also has some sore throat.   Dyspnea:  Not limited by breathing from desired activities  / did treadmill x 25 min x 3.7 mph Cough: none Sleeping: ok flat SABA use: zero 02: none St without assoc ear/ sinus complaints no fever  rec Brush teeth and tongue with arm and hammer toothpaste and make a slurry and gargle with it after using your symbicort  If throat gets worse call for ent referral > [facility] >>> CT neck 08/12/19 1. No explanation for pain. No abnormal calcification  of the stylohyoid ligaments. 2. Segmental or subsegmental micro nodularity in the right upper lobe, likely small airway impaction. Rec:   ppi no better  " always a 2/10"     10/04/2019  f/u ov/Jose Crane re: chronic asthma / h/o nonadherence/ L neck pain eval by ent/ ct neg Dyspnea:  Not limited by breathing from desired activities  / walking the dog/ men's tennis  Cough: none  Sleeping: able to lie flat  SABA use: none  02: none  Rec No change in meds   Covid 15 August 2020 >>  only rx was isolation.   11/29/2020  f/u ov/Jose Crane re:  asthma/  hbp maint on symb 160 2bid  Chief Complaint  Patient presents with   Follow-up    Doing well and no co's today. He has not used his rescue inhaler recently.   Dyspnea:  tennis ok  Cough: none Sleeping: no resp symptoms SABA use: none 02: none  Covid status:   vax x 3    No obvious day to day or daytime variability or assoc excess/ purulent sputum or mucus plugs or hemoptysis or cp or chest tightness, subjective wheeze or overt sinus or hb symptoms.   Sleeping  without nocturnal  or early am exacerbation  of respiratory  c/o's or need for noct saba. Also denies any obvious fluctuation of symptoms with weather or environmental changes or other aggravating or alleviating factors except as outlined above   No unusual exposure hx or h/o childhood pna or knowledge of premature birth.  Current Allergies, Complete Past Medical History, Past Surgical History, Family History, and Social History were reviewed in [employer] record.  ROS  The following are not active complaints unless bolded Hoarseness, sore throat, dysphagia, dental problems, itching, sneezing,  nasal congestion or discharge of excess mucus or purulent secretions, ear ache,   fever, chills, sweats, unintended wt loss or wt gain, classically pleuritic or exertional cp,  orthopnea pnd or arm/hand swelling  or leg swelling, presyncope, palpitations, abdominal pain,  anorexia, nausea, vomiting, diarrhea  or change in bowel habits or change in bladder habits, change in stools or change in urine, dysuria, hematuria,  rash, arthralgias, visual complaints, headache, numbness, weakness or ataxia or problems with walking or coordination,  change in mood or  memory.        Current Meds  Medication Sig   albuterol (PROAIR HFA) 108 (90 Base) MCG/ACT inhaler INHALE 2 PUFFS INTO THE LUNGS EVERY 6 HOURS  AS NEEDED FOR WHEEZING OR SHORTNESS OF BREATH   aspirin 325 MG tablet Take 325 mg by mouth daily.   budesonide-formoterol (SYMBICORT) 160-4.5 MCG/ACT inhaler INHALE 2 PUFFS FIRST THING IN THE MORNING THEN ANOTHER 2 PUFFS ABOUT 12 HOURS LATER   losartan (COZAAR) 25 MG tablet Take 1 tablet (25 mg total) by mouth daily.                       Past Medical History:  HYPERLIPIDEMIA (ICD-272.4)  ASTHMA (ICD-493.90)  HEALTH MAINTENANCE .............................Marland Kitchen  Referred to [facility]  11/29/2020  - Pneumovax 02/2004 (second shot) and prevnar 13 11/24/2013  - Tdap 05/20/11 - CPX  01/05/2018  Ruptured achilles April 2010  DVT Left leg sp achilles surgery July 2010  - Repeat doppler 04/10/09: Extensive chronic L Leg DVT  - repeat doppler 10/11/09---resolution of DVT in CFV and SFV, residual clot in popliteal and peroneal veins > f/u [name]      Objective:   Physical Exam  11/29/2020   201 10/04/2019  205  06/30/2019  203  01/05/2018  201 Wt 209 March 14, 2008 > 216 May 15, 2009 >  > 222 November 07, 2009 > 221 05/20/2011 > 11/04/2012 196 > 05/28/2013  204 > 11/24/2013  206 > 09/13/2015  199 > 12/18/2015  208 >         Vital signs reviewed  11/29/2020  - Note at rest 02 sats  98% on RA   General appearance:    amb wm nad    HEENT : pt wearing mask not removed for exam due to covid -19 concerns.    NECK :  without JVD/Nodes/TM/ nl carotid upstrokes bilaterally   LUNGS: no acc muscle use,  Nl contour chest which is clear to A and P bilaterally without cough on insp or exp  maneuvers   CV:  RRR  no s3 or murmur or increase in P2, and mild serpiginous  vericosities L >R leg   ABD:  soft and nontender with nl inspiratory excursion in the supine position. No bruits or organomegaly appreciated, bowel sounds nl  MS:  Nl gait/ ext warm without deformities, calf tenderness, cyanosis or clubbing No obvious joint restrictions   SKIN: warm and dry no rash   NEURO:  alert, approp, nl sensorium with  no motor or cerebellar deficits apparent.                          Assessment & Plan:

## 2021-06-05 ENCOUNTER — Other Ambulatory Visit: Payer: Self-pay | Admitting: Adult Health

## 2021-07-18 IMAGING — CT CT HEAD W/O CM
4 series · 16 of 47 positions shown, 18 images · non-contrast
Comparison: None.

CLINICAL DATA: Dizziness.

EXAM:
CT HEAD WITHOUT CONTRAST
TECHNIQUE: Contiguous axial images were obtained from the base of the skull
through the vertex without intravenous contrast.

[Series 2: head wo · axial · 0.45mm/px · z∈[-230,-115]mm · 7 of 31 slices shown, 9 images]
[im 4/31  brain]
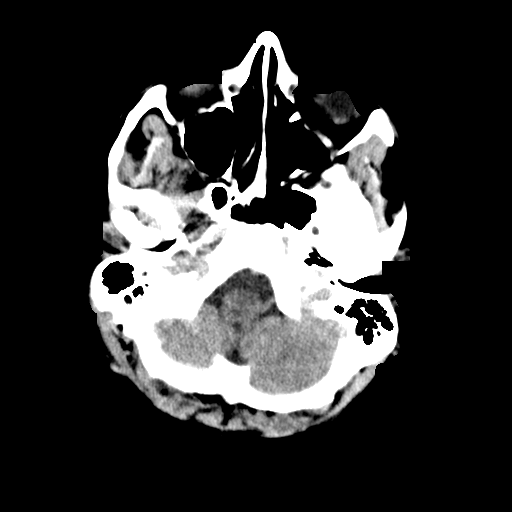
[im 4/31  bone]
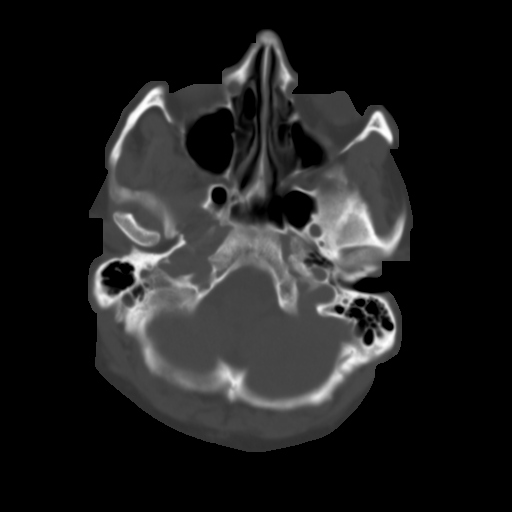
[im 8/31  brain]
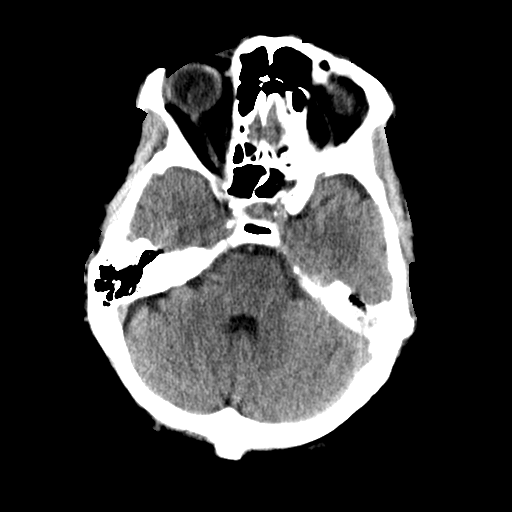
[im 12/31  brain]
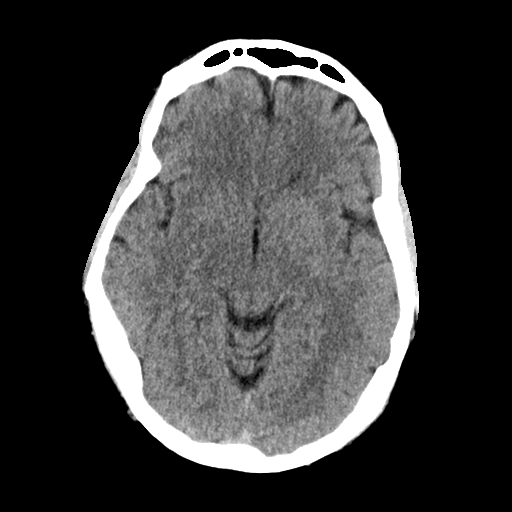
[im 16/31  brain]
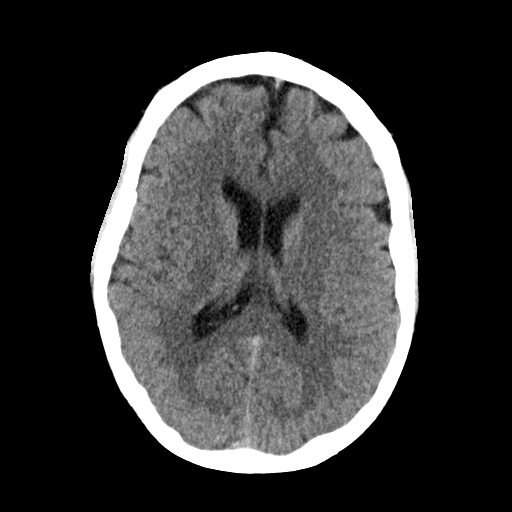
[im 19/31  brain]
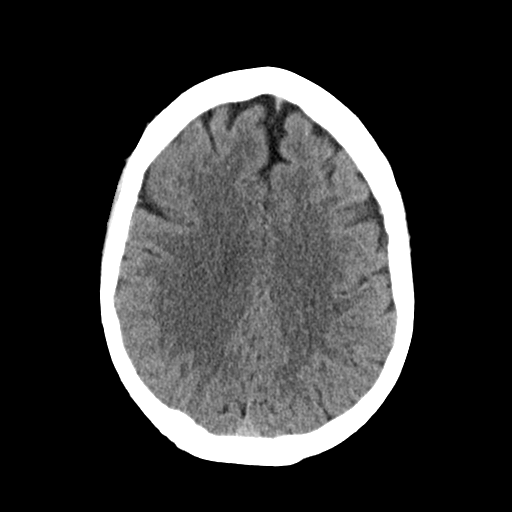
[im 19/31  bone]
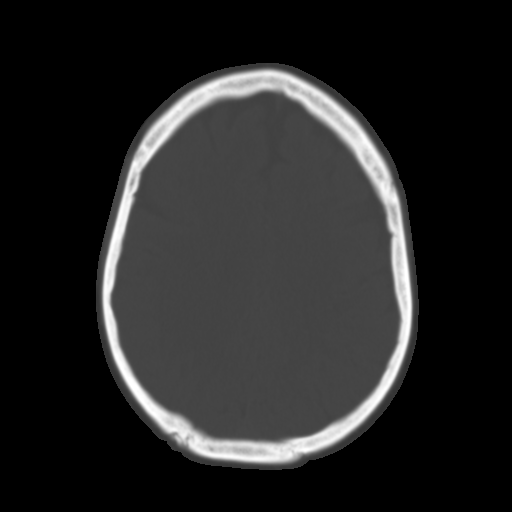
[im 23/31  brain]
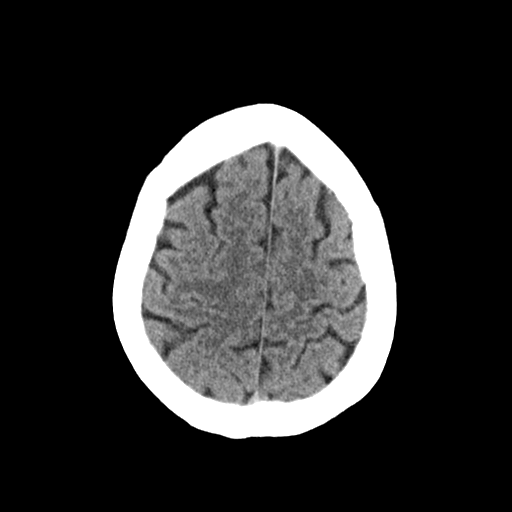
[im 27/31  brain]
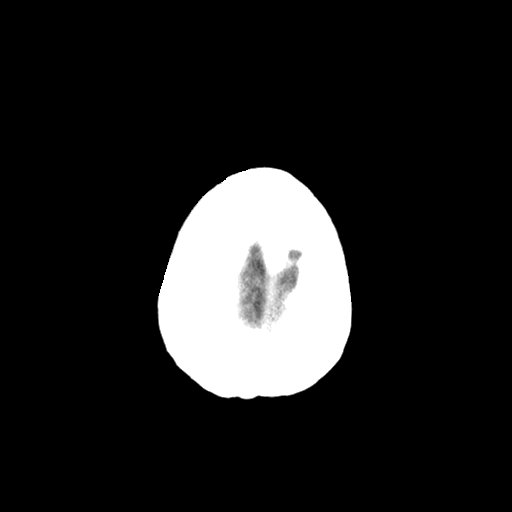

[Series 3: head bone · axial · 0.45mm/px · z∈[-231,-199]mm · 3 of 78 slices shown]
[im 8/78  bone]
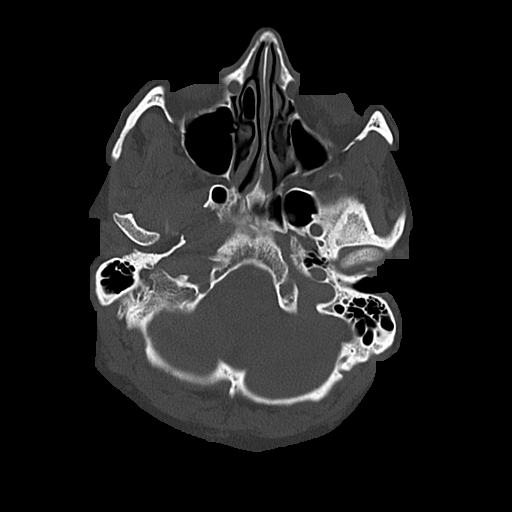
[im 16/78  bone]
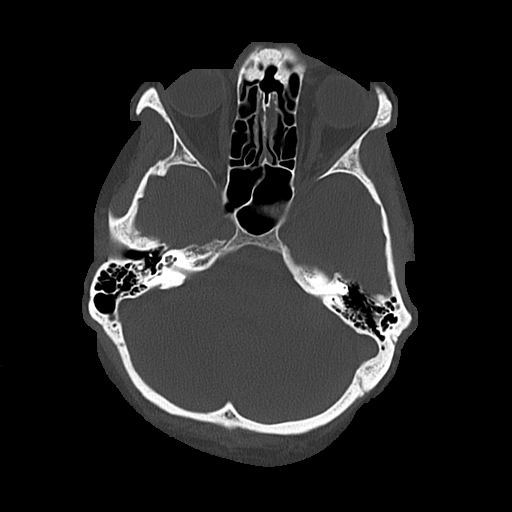
[im 24/78  bone]
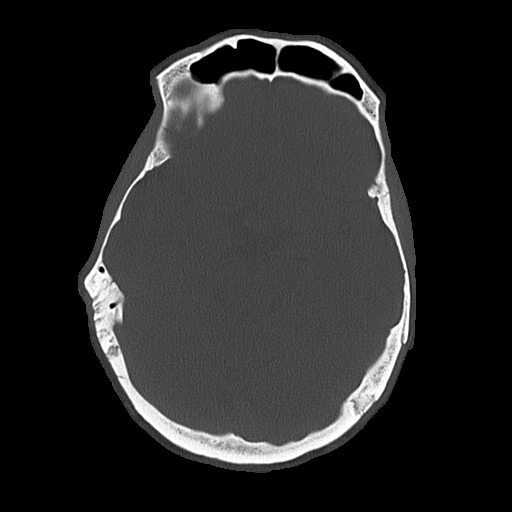

[Series 4: coronal soft · coronal · 0.32mm/px · 3 of 72 slices shown]
[im 24/72  brain]
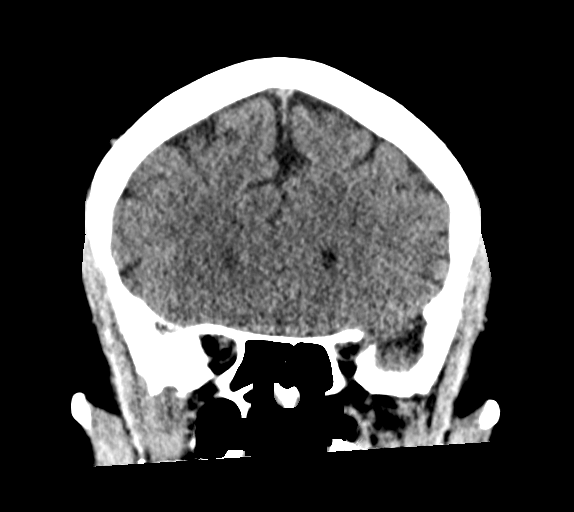
[im 32/72  brain]
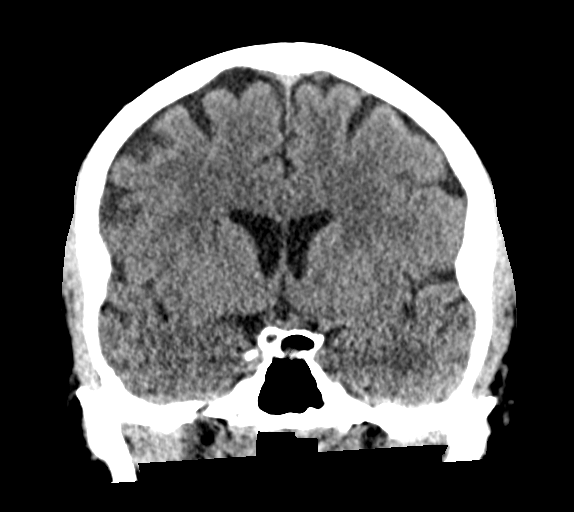
[im 40/72  brain]
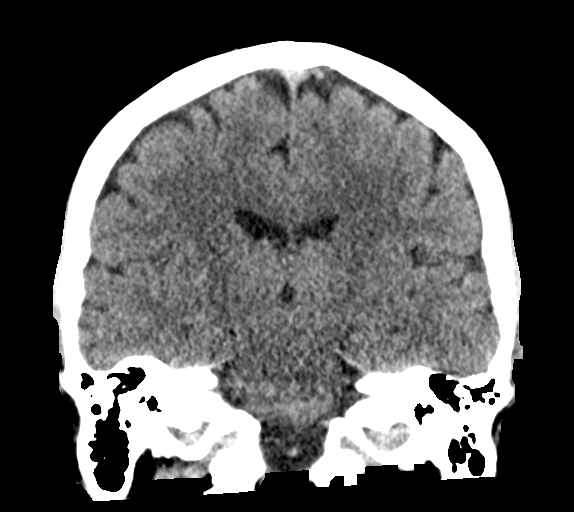

[Series 5: sagittal soft · sagittal · 0.32mm/px · 3 of 62 slices shown]
[im 21/62  brain]
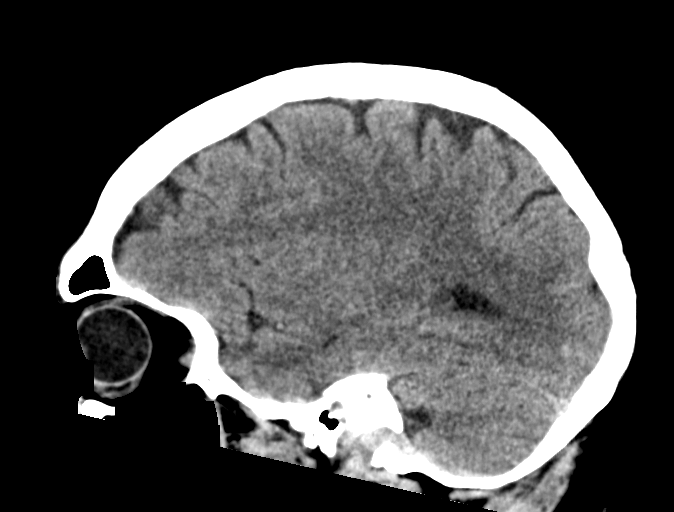
[im 31/62  brain]
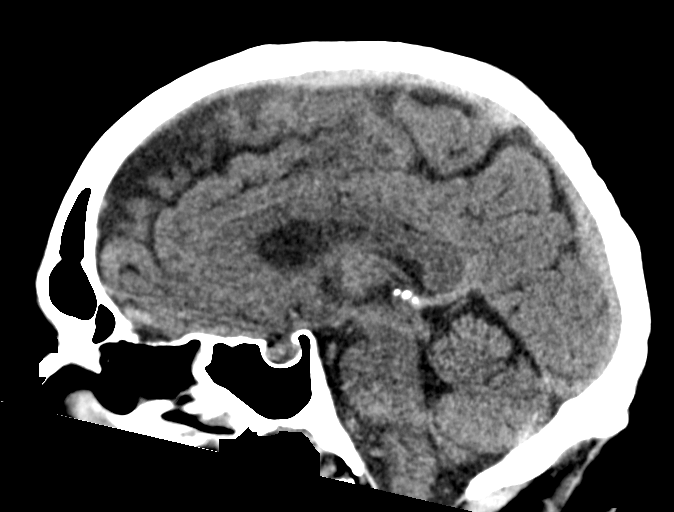
[im 41/62  brain]
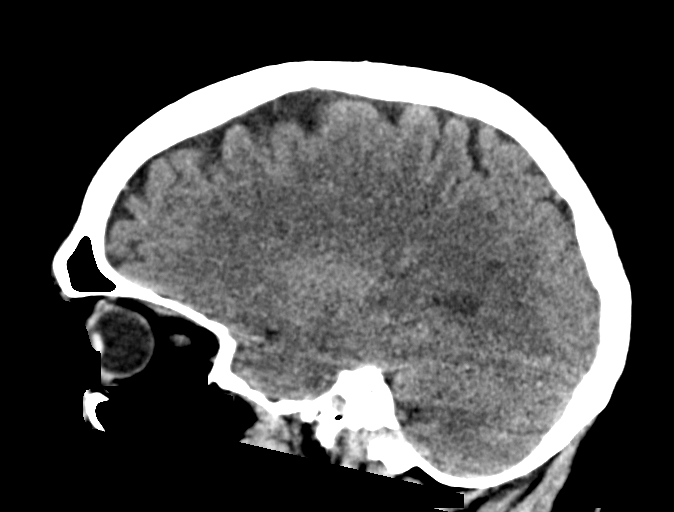

[16 of 47 positions shown; findings below may reference images not displayed]

FINDINGS: Brain: No intracranial hemorrhage, mass effect, or midline shift. No
hydrocephalus. The basilar cisterns are patent. No evidence of
territorial infarct or acute ischemia. No extra-axial or
intracranial fluid collection.

Vascular: No hyperdense vessel or unexpected calcification.

Skull: Normal. Negative for fracture or focal lesion.

Sinuses/Orbits: Paranasal sinuses and mastoid air cells are clear.
The visualized orbits are unremarkable.

Other: None.
IMPRESSION: Negative noncontrast head CT.

## 2021-12-04 ENCOUNTER — Encounter (HOSPITAL_BASED_OUTPATIENT_CLINIC_OR_DEPARTMENT_OTHER): Payer: Self-pay | Admitting: Emergency Medicine

## 2021-12-04 ENCOUNTER — Emergency Department (HOSPITAL_BASED_OUTPATIENT_CLINIC_OR_DEPARTMENT_OTHER): Payer: BC Managed Care – PPO

## 2021-12-04 ENCOUNTER — Emergency Department (HOSPITAL_BASED_OUTPATIENT_CLINIC_OR_DEPARTMENT_OTHER): Payer: BC Managed Care – PPO | Admitting: Radiology

## 2021-12-04 ENCOUNTER — Emergency Department (HOSPITAL_BASED_OUTPATIENT_CLINIC_OR_DEPARTMENT_OTHER)
Admission: EM | Admit: 2021-12-04 | Discharge: 2021-12-04 | Disposition: A | Discharge status: Home / Self Care | Payer: BC Managed Care – PPO | Attending: Emergency Medicine | Admitting: Emergency Medicine

## 2021-12-04 ENCOUNTER — Other Ambulatory Visit: Payer: Self-pay

## 2021-12-04 DIAGNOSIS — M79661 Pain in right lower leg: Secondary | ICD-10-CM | POA: Diagnosis not present

## 2021-12-04 DIAGNOSIS — W1839XA Other fall on same level, initial encounter: Secondary | ICD-10-CM | POA: Diagnosis not present

## 2021-12-04 DIAGNOSIS — S8011XA Contusion of right lower leg, initial encounter: Secondary | ICD-10-CM

## 2021-12-04 DIAGNOSIS — S8991XA Unspecified injury of right lower leg, initial encounter: Secondary | ICD-10-CM | POA: Diagnosis present

## 2021-12-04 NOTE — ED Triage Notes (Signed)
Pt arrives to ED with c/o leg swelling and injury. Pt reports fall on top of a tennis racket x2 weeks ago and injuring his right lower leg. Pt reports the leg continues to be swollen.

## 2021-12-04 NOTE — Discharge Instructions (Signed)
Your x-ray suggested soft tissue swelling and your DVT study was negative for blood clots.  Monitor for signs of infection as we discussed, return if these occur.  The bruising should gradually improve over the next couple of weeks, but follow-up sooner if you have any acute worsening.

## 2021-12-04 NOTE — ED Provider Notes (Signed)
[facility] EMERG[facility] DEPT Provider Note   CSN: [id number] Arrival date & time: 12/04/21  1436     History  Chief Complaint  Patient presents with   Leg Swelling    NTHONY Crane is a 56 y.o. male.  Patient presents to the emergency department today for evaluation of right lower extremity swelling.  Patient has a history of provoked DVT after Achilles tendon rupture on the left.  He is currently 325 mg aspirin daily.  Patient states that he had a fall and struck his right anterior lower leg on a tennis racquet 1 to 2 weeks ago.  Patient had significant anterior swelling.  He developed swelling in the lower leg and foot as well as bruising.  He states that swelling is improved.  He had a family friend see him today and recommended that he come to the emergency department for evaluation of blood clot and infection.  No fevers.  He states that he is still active, mowing the yard, without any discomfort or pain.  When he does have throbbing pain in the lower leg, it is short-lived.  No streaking or fevers.       Home Medications Prior to Admission medications   Medication Sig Start Date End Date Taking? Authorizing Provider  albuterol (PROAIR HFA) 108 (90 Base) MCG/ACT inhaler INHALE 2 PUFFS INTO THE LUNGS EVERY 6 HOURS AS NEEDED FOR WHEEZING OR SHORTNESS OF BREATH 10/09/20   Parrett, Fonnie Mu, NP  aspirin 325 MG tablet Take 325 mg by mouth daily.    [provider]  budesonide-formoterol (SYMBICORT) 160-4.5 MCG/ACT inhaler INHALE 2 PUFFS FIRST THING IN THE MORNING THEN ANOTHER 2 PUFFS ABOUT 12 HOURS LATER 10/09/20   Parrett, Fonnie Mu, NP  losartan (COZAAR) 25 MG tablet TAKE 1 TABLET (25 MG TOTAL) BY MOUTH DAILY. 06/05/21   Tanda Rockers, MD  sildenafil (VIAGRA) 50 MG tablet Take 1 tablet (50 mg total) by mouth daily as needed for erectile dysfunction. 11/29/20   Tanda Rockers, MD      Allergies    Patient has no known allergies.    Review of Systems   Review  of Systems  Physical Exam Updated Vital Signs BP (!) 142/78   Pulse (!) 57   Temp (!) 97.4 F (36.3 C)   Resp 14   Ht 6' (1.829 m)   Wt 86.2 kg   SpO2 99%   BMI 25.77 kg/m  Physical Exam Vitals and nursing note reviewed.  Constitutional:      Appearance: He is well-developed.  HENT:     Head: Normocephalic and atraumatic.  Eyes:     Conjunctiva/sclera: Conjunctivae normal.  Pulmonary:     Effort: No respiratory distress.  Musculoskeletal:        General: No swelling.     Cervical back: Normal range of motion and neck supple.     Comments: Patient with anterior proximal lower leg hematoma with associated ecchymosis.  There are healing overlying abrasions.  No distinct cellulitis or lymphangitic streaking.  No significant tenderness.  Minimal warmth.  Patient does have some generalized edema distal to this extending to the ankle and foot.  He has resolving bruising over the top of the foot and toes.  2+ pedal pulses bilaterally.  Skin:    General: Skin is warm and dry.  Neurological:     Mental Status: He is alert.     ED Results / Procedures / Treatments   Labs (all labs ordered are  listed, but only abnormal results are displayed) Labs Reviewed - No data to display  EKG None  Radiology US Venous Img Lower Unilateral Right  Result Date: 12/04/2021 CLINICAL DATA:  Right lower extremity swelling EXAM: RIGHT LOWER EXTREMITY VENOUS DOPPLER ULTRASOUND TECHNIQUE: Gray-scale sonography with compression, as well as color and duplex ultrasound, were performed to evaluate the deep venous system(s) from the level of the common femoral vein through the popliteal and proximal calf veins. COMPARISON:  None Available. FINDINGS: VENOUS Normal compressibility of the common femoral, superficial femoral, and popliteal veins, as well as the visualized calf veins. Visualized portions of profunda femoral vein and great saphenous vein unremarkable. No filling defects to suggest DVT on grayscale  or color Doppler imaging. Doppler waveforms show normal direction of venous flow, normal respiratory plasticity and response to augmentation. Limited views of the contralateral common femoral vein are unremarkable. OTHER None. Limitations: none IMPRESSION: Negative. Electronically Signed   By: Jacqulynn Cadet M.D.   On: 12/04/2021 16:41   DG Tibia/Fibula Right  Result Date: 12/04/2021 CLINICAL DATA:  leg injury EXAM: RIGHT TIBIA AND FIBULA - 2 VIEW COMPARISON:  None Available. FINDINGS: There is no evidence of fracture or other focal bone lesions. Diffuse subcutaneus soft tissue edema with focal edema noted anterolaterally along the proximal tibia. Varicose veins. Vascular calcification. No retained radiopaque foreign body. IMPRESSION: 1. No acute displaced fracture or dislocation. 2. Diffuse subcutaneus soft tissue edema with focal edema noted anterolaterally along the proximal tibia. Correlate clinically with bruising versus underlying lesion. Electronically Signed   By: Iven Finn M.D.   On: 12/04/2021 15:23    Procedures Procedures    Medications Ordered in ED Medications - No data to display  ED Course/ Medical Decision Making/ A&P    Patient seen and examined. History obtained directly from patient. Work-up including labs, imaging, EKG ordered in triage, if performed, were reviewed.    Labs/EKG: None ordered.  Imaging: Independently reviewed and interpreted.  This included: X-ray of the right tib-fib, agree soft tissue swelling, no fractures.  Lower extremity ultrasound resulted as negative.  Medications/Fluids: None ordered.  Most recent vital signs reviewed and are as follows: BP (!) 142/78   Pulse (!) 57   Temp (!) 97.4 F (36.3 C)   Resp 14   Ht 6' (1.829 m)   Wt 86.2 kg   SpO2 99%   BMI 25.77 kg/m   Initial impression: Right lower extremity hematoma with associated ecchymosis and swelling, no distinct infection.  Home treatment plan: Continued elevation when  able.  Return instructions discussed with patient: Pt urged to return with worsening pain, worsening swelling, expanding area of redness or streaking up extremity, fever, or any other concerns.   Follow-up instructions discussed with patient: Follow-up with PCP as needed if any changes, continue to expect gradual improvement.                          Medical Decision Making Amount and/or Complexity of Data Reviewed Radiology: ordered.   Patient with right lower extremity hematoma ongoing for almost 2 weeks.  He was mainly concerned about a recurrent DVT today and possible infection.  DVT study negative.  Lower extremity x-ray negative for fracture.  No signs of abscess or acute cellulitis.  Patient without fevers or other findings such as pain, warmth or drainage.  Patient will continue to monitor until resolution.        Final Clinical Impression(s) / ED Diagnoses  Final diagnoses:  Hematoma of right lower extremity, initial encounter    Rx / DC Orders ED Discharge Orders     None         Carlisle Cater, PA-C 12/04/21 1742    Regan Lemming, MD 12/04/21 [phone]

## 2021-12-10 ENCOUNTER — Other Ambulatory Visit: Payer: Self-pay | Admitting: Internal Medicine

## 2021-12-10 MED ORDER — LOSARTAN POTASSIUM 25 MG PO TABS: 25.0000 mg | ORAL_TABLET | Freq: Every day | ORAL | 0 refills | Status: DC

## 2022-01-10 ENCOUNTER — Telehealth: Payer: Self-pay | Admitting: Internal Medicine

## 2022-01-14 NOTE — Telephone Encounter (Signed)
There has been a change of plans - I was going to [facility] but not anymore.  If I'm seeing him for asthma f/u anyway I will be happy to see him for Primary care as long as he understands the deductible will be higher as I am listed as a specialist - if wants PCP though I would recommend [facility] nearest his job or home location

## 2022-01-14 NOTE — Telephone Encounter (Signed)
Patient would like recommendations from Dr Melvyn Novas for PCP  Please advise sir

## 2022-01-16 NOTE — Telephone Encounter (Signed)
Attempted to call patient and someone picked up but did not say anything. Could hear music in the background. Will try again later.

## 2022-01-17 NOTE — Telephone Encounter (Signed)
ATC patient back but got no answer. Left voicemail to call office back if he still needed [place]. Closing encounter since this is the second attempt to reach patient.

## 2022-01-18 ENCOUNTER — Telehealth: Payer: Self-pay | Admitting: Internal Medicine

## 2022-01-18 NOTE — Telephone Encounter (Signed)
patient would like to keep Dr. Melvyn Novas as primary care and is okay with higher deductible. Patient scheduled a follow up/ annual on 02/13/2022 at 9:30am with Dr. Melvyn Novas- would like to know if he needs to do anything to prepare the night before? Fast?   Please advise

## 2022-01-18 NOTE — Telephone Encounter (Signed)
Called and spoke to patient and went over recommendations with patient. Patient verbalized understanding. Nothing further needed

## 2022-01-18 NOTE — Telephone Encounter (Signed)
Fasting is only rec  - take all meds at usual times with water

## 2022-01-18 NOTE — Telephone Encounter (Signed)
(  See previous encounter from 9/14) patient would like to keep Dr. Melvyn Novas as primary care and is okay with higher deductible. Patient scheduled a follow up/ annual on 02/13/2022 at 9:30am with Dr. Melvyn Novas- would like to know if he needs to do anything to prepare the night before? Fast?   Please advise.

## 2022-02-10 ENCOUNTER — Encounter (HOSPITAL_BASED_OUTPATIENT_CLINIC_OR_DEPARTMENT_OTHER): Payer: Self-pay

## 2022-02-10 ENCOUNTER — Emergency Department (HOSPITAL_BASED_OUTPATIENT_CLINIC_OR_DEPARTMENT_OTHER): Payer: BC Managed Care – PPO | Admitting: Radiology

## 2022-02-10 ENCOUNTER — Other Ambulatory Visit: Payer: Self-pay

## 2022-02-10 ENCOUNTER — Emergency Department (HOSPITAL_BASED_OUTPATIENT_CLINIC_OR_DEPARTMENT_OTHER)
Admission: EM | Admit: 2022-02-10 | Discharge: 2022-02-10 | Disposition: A | Discharge status: Home / Self Care | Payer: BC Managed Care – PPO | Attending: Emergency Medicine | Admitting: Emergency Medicine

## 2022-02-10 DIAGNOSIS — I10 Essential (primary) hypertension: Secondary | ICD-10-CM | POA: Insufficient documentation

## 2022-02-10 DIAGNOSIS — K219 Gastro-esophageal reflux disease without esophagitis: Secondary | ICD-10-CM | POA: Insufficient documentation

## 2022-02-10 DIAGNOSIS — R109 Unspecified abdominal pain: Secondary | ICD-10-CM | POA: Insufficient documentation

## 2022-02-10 DIAGNOSIS — Z7982 Long term (current) use of aspirin: Secondary | ICD-10-CM | POA: Diagnosis not present

## 2022-02-10 DIAGNOSIS — R079 Chest pain, unspecified: Secondary | ICD-10-CM | POA: Diagnosis not present

## 2022-02-10 DIAGNOSIS — Z79899 Other long term (current) drug therapy: Secondary | ICD-10-CM | POA: Insufficient documentation

## 2022-02-10 DIAGNOSIS — R112 Nausea with vomiting, unspecified: Secondary | ICD-10-CM | POA: Insufficient documentation

## 2022-02-10 LAB — URINALYSIS, ROUTINE W REFLEX MICROSCOPIC
Bilirubin Urine: NEGATIVE
Glucose, UA: NEGATIVE mg/dL
Leukocytes,Ua: NEGATIVE
Nitrite: NEGATIVE
Specific Gravity, Urine: 1.016 (ref 1.005–1.030)
pH: 5.5 (ref 5.0–8.0)

## 2022-02-10 LAB — COMPREHENSIVE METABOLIC PANEL
ALT: 23 U/L (ref 0–44)
AST: 26 U/L (ref 15–41)
Albumin: 4.2 g/dL (ref 3.5–5.0)
Alkaline Phosphatase: 65 U/L (ref 38–126)
Anion gap: 11 (ref 5–15)
BUN: 17 mg/dL (ref 6–20)
CO2: 24 mmol/L (ref 22–32)
Calcium: 10 mg/dL (ref 8.9–10.3)
Chloride: 96 mmol/L — ABNORMAL LOW (ref 98–111)
Creatinine, Ser: 1.03 mg/dL (ref 0.61–1.24)
GFR, Estimated: >60 mL/min (ref >60)
Glucose, Bld: 117 mg/dL — ABNORMAL HIGH (ref 70–99)
Potassium: 3.6 mmol/L (ref 3.5–5.1)
Sodium: 131 mmol/L — ABNORMAL LOW (ref 135–145)
Total Bilirubin: 0.8 mg/dL (ref 0.3–1.2)
Total Protein: 8.1 g/dL (ref 6.5–8.1)

## 2022-02-10 LAB — CBC
HCT: 41.6 % (ref 39.0–52.0)
Hemoglobin: 14.4 g/dL (ref 13.0–17.0)
MCH: 29.2 pg (ref 26.0–34.0)
MCHC: 34.6 g/dL (ref 30.0–36.0)
MCV: 84.4 fL (ref 80.0–100.0)
Platelets: 254 10*3/uL (ref 150–400)
RBC: 4.93 MIL/uL (ref 4.22–5.81)
RDW: 12.2 % (ref 11.5–15.5)
WBC: 7.8 10*3/uL (ref 4.0–10.5)
nRBC: 0 % (ref 0.0–0.2)

## 2022-02-10 LAB — TROPONIN I (HIGH SENSITIVITY)
Troponin I (High Sensitivity): 5 ng/L (ref <18)
Troponin I (High Sensitivity): 4 ng/L (ref <18)

## 2022-02-10 LAB — LIPASE, BLOOD: Lipase: <10 U/L — ABNORMAL LOW (ref 11–51)

## 2022-02-10 MED ORDER — ONDANSETRON HCL 4 MG/2ML IJ SOLN
4.0000 mg | Freq: Once | INTRAMUSCULAR | Status: AC
  Administered 2022-02-10: 4 mg via INTRAVENOUS
  Filled 2022-02-10: qty 2

## 2022-02-10 MED ORDER — SODIUM CHLORIDE 0.9 % IV BOLUS
500.0000 mL | Freq: Once | INTRAVENOUS | Status: AC
  Administered 2022-02-10: 500 mL via INTRAVENOUS

## 2022-02-10 NOTE — ED Triage Notes (Signed)
Patient here POV from Home.  Endorses N/V. Nausea began 3 Days ago and today began having Emesis.  No Known fevers. No Diarrhea.   NAD Noted during Triage. A&Ox4. GCS 15. Ambulatory.

## 2022-02-10 NOTE — Discharge Instructions (Signed)
Thank you for coming to [facility] Emergency Department. You were seen for nausea/vomiting. We did an exam, labs, and imaging, and these showed a mildly low sodium, but no other acute findings. It is possible your nausea/vomiting is due to gastroenteritis or gastritis. You can try taking Pepcid 20 mg daily to see if this improves your symptoms.   Please follow up with your primary care provider within 1 week.   Do not hesitate to return to the ED or call 911 if you experience: -Worsening symptoms -Lightheadedness, passing out -Fevers/chills -Anything else that concerns you

## 2022-02-10 NOTE — ED Provider Notes (Signed)
[facility] EMERGENCY DEPT Provider Note   CSN: [id number] Arrival date & time: 02/10/22  1459     History  Chief Complaint  Patient presents with   Emesis    Jose Crane is a 56 y.o. male with HTN, HLD, BPPV, history of provoked left lower extremity DVT after Achilles tendon surgery presents with nausea/vomiting.     Nausea began 3 Days ago and today began having emesis last night. Nonbloody. A/w left sided abdominal pain and left-sided chest pain. Not constant, rated 3/10. Located on L side "under ribs" was worse with breathing but that went away, came back a little lower in his left side of his upper abdomen. Right now he feels okay and has no pain.   Also reports he recently lost 15 pounds from playing tennis and stopped taking his blood pressure medication "to see if he still needed it."    No Known fevers/chills, SOB, palpitations, diarrhea/constipation, melena/hematochezia, urinary sxs, testicular/scrotal pain/swelling.   Per chart review was seen on 12/04/2021 for right lower extremity swelling with negative DVT work-up   Emesis      Home Medications Prior to Admission medications   Medication Sig Start Date End Date Taking? Authorizing Provider  albuterol (PROAIR HFA) 108 (90 Base) MCG/ACT inhaler INHALE 2 PUFFS INTO THE LUNGS EVERY 6 HOURS AS NEEDED FOR WHEEZING OR SHORTNESS OF BREATH 10/09/20   Parrett, Fonnie Mu, NP  aspirin 325 MG tablet Take 325 mg by mouth daily.    [provider]  budesonide-formoterol (SYMBICORT) 160-4.5 MCG/ACT inhaler INHALE 2 PUFFS FIRST THING IN THE MORNING THEN ANOTHER 2 PUFFS ABOUT 12 HOURS LATER 10/09/20   Parrett, Fonnie Mu, NP  losartan (COZAAR) 25 MG tablet Take 1 tablet (25 mg total) by mouth daily. 12/10/21   Tanda Rockers, MD  sildenafil (VIAGRA) 50 MG tablet Take 1 tablet (50 mg total) by mouth daily as needed for erectile dysfunction. 11/29/20   Tanda Rockers, MD      Allergies    Patient has no known  allergies.    Review of Systems   Review of Systems  Gastrointestinal:  Positive for vomiting.   Review of systems negative for f/c.  A 10 point review of systems was performed and is negative unless otherwise reported in HPI.  Physical Exam Updated Vital Signs BP (!) 169/61 (BP Location: Right Arm)   Pulse 62   Temp 97.7 F (36.5 C) (Oral)   Resp 18   Ht 6' (1.829 m)   Wt 86.2 kg   SpO2 99%   BMI 25.77 kg/m  Physical Exam General: Normal appearing male, lying in bed.  HEENT: PERRLA, Sclera anicteric, MMM, trachea midline. Cardiology: RRR, no murmurs/rubs/gallops. BL radial and DP pulses equal bilaterally. No TTP to chest wall, no deformities or crepitus palpated. Resp: Normal respiratory rate and effort. CTAB, no wheezes, rhonchi, crackles.  Abd: Mild LUQ TTP. Soft,non-distended. No rebound tenderness or guarding.  GU: Deferred. MSK: No peripheral edema or signs of trauma. Extremities without deformity or TTP. No cyanosis or clubbing. Skin: warm, dry. No rashes or lesions. Back: No CVA tenderness Neuro: A&Ox4, CNs II-XII grossly intact. MAEs. Sensation grossly intact.  Psych: Normal mood and affect.   ED Results / Procedures / Treatments   Labs (all labs ordered are listed, but only abnormal results are displayed) Labs Reviewed  LIPASE, BLOOD - Abnormal; Notable for the following components:      Result Value   Lipase <10 (*)  All other components within normal limits  COMPREHENSIVE METABOLIC PANEL - Abnormal; Notable for the following components:   Sodium 131 (*)    Chloride 96 (*)    Glucose, Bld 117 (*)    All other components within normal limits  URINALYSIS, ROUTINE W REFLEX MICROSCOPIC - Abnormal; Notable for the following components:   Hgb urine dipstick MODERATE (*)    Ketones, ur TRACE (*)    Protein, ur TRACE (*)    All other components within normal limits  CBC    EKG EKG Interpretation  Date/Time:  Sunday February 10 2022 19:19:37  EDT Ventricular Rate:  54 PR Interval:  169 QRS Duration: 104 QT Interval:  450 QTC Calculation: 427 R Axis:   88 Text Interpretation: Sinus rhythm Confirmed by Cindee Lame [phone]) on 02/10/2022 8:02:06 PM  Radiology  CXR: No active cardiopulmonary disease.   Procedures Procedures    Medications Ordered in ED Medications  ondansetron (ZOFRAN) injection 4 mg (4 mg Intravenous Given 02/10/22 1908)  sodium chloride 0.9 % bolus 500 mL (0 mLs Intravenous Stopped 02/10/22 2059)    ED Course/ Medical Decision Making/ A&P                          Medical Decision Making Amount and/or Complexity of Data Reviewed Labs: ordered. Decision-making details documented in ED Course. Radiology: ordered. Decision-making details documented in ED Course.  Risk Prescription drug management.   Patient is overall very well-appearing, afebrile, hemodynamically stable. Abd exam is reassuring, no signs of acute abdomen.  For patient's abd pain, N/V, consider: GERD/gastritis given N/V and LUQ pain, pancreatitis, ACS/arrhythmia given CP/N/V, UTI/pyelonephritis, PNA though no cough/fever, muscle strain/sprain, gastroenteritis. Very low c/f other acute life threatening causes of CP/abd pain such as aortic dissection, AAA, cardiac tamponade, or PE given patient's presenting symptoms, and he has no pain currently and is very well-appearing. He also recently had a negative DVT study and currently has no signs/symptoms of DVT, well's negative. No hypoxia/SOB, with equal pulses in all extremities.   Labs demonstrate sodium 131, glucose 117, creatinine 1.03, BUN 17, lipase less than 10, no leukocytosis or anemia, and UA with moderate hemoglobin, trace ketones, trace protein, no evidence of infection. Troponin 5-4. And EKG shows no signs of ischemia or arrhythmia. CXR is clear with no effusions/pneumonia. He is slightly bradycardic but appears to be active. Patient is given zofran for nausea and small fluid  bolus.   I have personally reviewed and interpreted all labs and imaging.   Clinical Course as of 03/04/22 1314  Sun Feb 10, 2022  1831 DG Chest 2 View No active cardiopulmonary disease. [HN]  2001 Troponin I (High Sensitivity): 5 [HN]  2001 DG Chest 2 View No active cardiopulmonary disease. [HN]  2150 Troponin I (High Sensitivity): 4 [HN]  2150 Patient reports that he feels better. Passed PO challenge. Recommended to f/u with his PCP within 1 week. Discussed possible causes of n/v including gastroenteritis or GERD. Suggested trying pepcid 20 mg daily for possible gerd/gastritis. Patient given DC instructions/return precautions, all questions answered to patient's satisfaction. [HN]    Clinical Course User Index [HN] Audley Hose, MD          Final Clinical Impression(s) / ED Diagnoses Final diagnoses:  Nausea and vomiting, unspecified vomiting type    Rx / DC Orders ED Discharge Orders     None        This note was created using  dictation software, which may contain spelling or grammatical errors.    Audley Hose, MD 03/09/22 [phone]

## 2022-02-11 ENCOUNTER — Other Ambulatory Visit: Payer: Self-pay | Admitting: Adult Health

## 2022-02-13 ENCOUNTER — Ambulatory Visit (INDEPENDENT_AMBULATORY_CARE_PROVIDER_SITE_OTHER): Payer: BC Managed Care – PPO | Admitting: Internal Medicine

## 2022-02-13 ENCOUNTER — Encounter: Payer: Self-pay | Admitting: Internal Medicine

## 2022-02-13 DIAGNOSIS — I82402 Acute embolism and thrombosis of unspecified deep veins of left lower extremity: Secondary | ICD-10-CM | POA: Diagnosis not present

## 2022-02-13 DIAGNOSIS — I10 Essential (primary) hypertension: Secondary | ICD-10-CM

## 2022-02-13 DIAGNOSIS — J4540 Moderate persistent asthma, uncomplicated: Secondary | ICD-10-CM | POA: Diagnosis not present

## 2022-02-13 NOTE — Assessment & Plan Note (Signed)
Referred to Primary Care Patient/ [place] 02/13/2022    Lab Results  Component Value Date   CREATININE 1.03 02/10/2022   CREATININE 1.12 10/04/2020   CREATININE 1.31 10/04/2019    F/u PCP         Each maintenance medication was reviewed in detail including emphasizing most importantly the difference between maintenance and prns and under what circumstances the prns are to be triggered using an action plan format where appropriate.  Total time for H and P, chart review, counseling, reviewing hfa device(s) and generating customized AVS unique to this office visit / same day charting = 25 min

## 2022-02-13 NOTE — Patient Instructions (Addendum)
My office will be contacting you by phone for referral to Internal Medicine at [facility]   - if you don't hear back from my office within one week please call us back or notify us thru MyChart and we'll address it right away.   No change in medications

## 2022-02-13 NOTE — Assessment & Plan Note (Signed)
DVT Left leg after achilles surgery July 2010  - Repeat doppler 04/10/09: Extensive chronic L Leg DVT  -repeat doppler 10/11/09---resolution of DVT in CFV and SFV, residual clot in popliteal and peroneal veins  -repeat doppler 09/03/2010 >>residual clot in left proximal popliteal vein. >>refer to vascular  -OV w/ Dr. Kellie Simmering 09/25/10  in Vascular w/ rec of possible trial off coumadin>>pt decided to stop coumadin      >>>Per phone conversation with pt on  02/19/2011 >>pt did not follow up with Dr. Melvyn Novas  As recommended by Dr. Kellie Simmering to discuss            Coumadin , he stopped on 11/28/10 and does not want to be on couamdin, Is taking an ASA daily. [facility] notified- [name], RN. Pt advised of risks esp with immobility and travel. Pt verbalized understanding.  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 - Positive for a superficial thrombus of the greater saphenous vein coursing from about 3 inches above the ankle to three inches below the knee. 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 - Refer back to Dr Kellie Simmering  02/01/2016 >  Jose Crane daily   No evidence recurrence > no change in recs

## 2022-02-13 NOTE — Assessment & Plan Note (Addendum)
Onset in childhood/ req mech vent 1997  09/03/2010 > decreased symbicort 80/4.46mg  -  11/04/2012  > increased symbicort to 160 bid as not consistent with use  - 11/14/2017  After extensive coaching inhaler device  effectiveness =    90% > rec continue symb 160 2bid  - Spirometry 01/05/2018  FEV1 4.5 (111%)  Ratio 90 p no rx prior  - Allergy profile 01/05/18  >  Eos 0.5 /  IgE  598 RAST pos everything but mold  All goals of chronic asthma control met including optimal function and elimination of symptoms with minimal need for rescue therapy.  Contingencies discussed in full including contacting this office immediately if not controlling the symptoms using the rule of two's.     Continue prn symbocort 160  Based on two studies from NEJM  378; 20 p 1865 (2018) and 380 : p2020-30 (2019) in pts with mild asthma it is reasonable to use symbicort  2bid "prn" flare in this setting but I emphasized this was only shown with symbicort and takes advantage of the rapid onset of action but is not the same as "rescue therapy" but can be stopped once the acute symptoms have resolved and the need for rescue has been minimized (< 2 x weekly)     F/u here yearly if needed for refills

## 2022-02-13 NOTE — Progress Notes (Signed)
Subjective:    Patient ID: Jose Crane, male    DOB: 11-19-1965    MRN: [id number]    Brief patient profile:  48  yowm never smoker with a history of childhood asthma & status asthmaticus requiring mechanical ventilation 4/97 at [facility].  He typically uses Symbicort, more on an as-needed basis rather than every day. He has only needed albuterol  rarely    History of Present Illness  09/13/2015  f/u ov/Trenell Concannon re:  Chronic asthma on symbicort 160 2 each am/very rare saba  Chief Complaint  Patient presents with   Follow-up    Pt c/o "sensation" on the left side of his neck x 2 months.   area on neck is actually just under the mandible where he had recent dental surgery and denies ongoing dental concerns, pain, fever or chewing issues. No assoc ear ache or sinus problems  rec No change in recs   12/18/2015  f/u ov/Javarian Jakubiak re:  Chronic asthma/ no flares since last ov  Chief Complaint  Patient presents with   Follow-up    Pt states still having discomfort in neck on the left side. No new co's today.   sometimes does not have the neck discomfort and never wakes at night with it Present daily though since tooth work  avg symbicort 2 /4   Maybe saba once a week / ex tol good / sleeping ok  Not limited by breathing from desired activities   rec Plan A = Automatic = Symbicort 160 Take 2 puffs first thing in am and then another 2 puffs about 12 hours later.  Plan B = Backup Only use your albuterol as a rescue medication   CPX due 04/2016    Phone call  01/26/16 new L calf pain > venous doppler Pos sub phlebitis      Covid 15 August 2020 >>  only rx was isolation.   11/29/2020  f/u ov/Penni Penado re:  asthma/  hbp maint on symb 160 2bid  Chief Complaint  Patient presents with   Follow-up    Doing well and no co's today. He has not used his rescue inhaler recently.   Dyspnea:  tennis ok  Cough: none Sleeping: no resp symptoms SABA use: none 02: none  Covid status:   vax x 3  Rec F/u with  IM and here prn    02/13/2022  f/u ov/Timira Bieda re: asthma  maint on symbicort 160  avg once a day   Chief Complaint  Patient presents with   Follow-up    Annual visit. States he was at the ED on 10/15 for nausea and vomiting but is feeling much better. Stopped taking the Losartan due to recent weight loss.   Dyspnea:  tennis fine  Cough: none  Sleeping: no resp cc  SABA use: never any more 02: never  All gi ccc resolved s specific rx     No obvious day to day or daytime variability or assoc excess/ purulent sputum or mucus plugs or hemoptysis or cp or chest tightness, subjective wheeze or overt sinus or hb symptoms.   Sleeping  without nocturnal  or early am exacerbation  of respiratory  c/o's or need for noct saba. Also denies any obvious fluctuation of symptoms with weather or environmental changes or other aggravating or alleviating factors except as outlined above   No unusual exposure hx or h/o childhood pna  or knowledge of premature birth.  Current Allergies, Complete Past Medical History, Past Surgical History,  Family History, and Social History were reviewed in [employer] record.  ROS  The following are not active complaints unless bolded Hoarseness, sore throat, dysphagia, dental problems, itching, sneezing,  nasal congestion or discharge of excess mucus or purulent secretions, ear ache,   fever, chills, sweats, unintended wt loss or wt gain, classically pleuritic or exertional cp,  orthopnea pnd or arm/hand swelling  or leg swelling, presyncope, palpitations, abdominal pain, anorexia, nausea, vomiting, diarrhea  or change in bowel habits or change in bladder habits, change in stools or change in urine, dysuria, hematuria,  rash, arthralgias, visual complaints, headache, numbness, weakness or ataxia or problems with walking or coordination,  change in mood or  memory.        Current Meds  Medication Sig   albuterol (PROAIR HFA) 108 (90 Base) MCG/ACT inhaler  INHALE 2 PUFFS INTO THE LUNGS EVERY 6 HOURS AS NEEDED FOR WHEEZING OR SHORTNESS OF BREATH   aspirin 325 MG tablet Take 325 mg by mouth daily.   budesonide-formoterol (SYMBICORT) 160-4.5 MCG/ACT inhaler INHALE 2 PUFFS BY MOUTH FIRST THING IN THE AM, THEN 2 PPUFFS ABOUT 12 HOURS LATER   sildenafil (VIAGRA) 50 MG tablet Take 1 tablet (50 mg total) by mouth daily as needed for erectile dysfunction.           Past Medical History:  HYPERLIPIDEMIA (ICD-272.4)  ASTHMA (ICD-493.90)  HEALTH MAINTENANCE .............................Marland Kitchen  Referred to [facility]  02/13/2022  - Pneumovax 02/2004 (second shot) and prevnar 13 11/24/2013  - Tdap 05/20/11 - CPX  01/05/2018  Ruptured achilles April 2010  DVT Left leg sp achilles surgery July 2010  - Repeat doppler 04/10/09: Extensive chronic L Leg DVT  - repeat doppler 10/11/09---resolution of DVT in CFV and SFV, residual clot in popliteal and peroneal veins > f/u Kellie Simmering (vascular surgery) rec asa only       Objective:   Physical Exam  Wts  02/13/2022  185 11/29/2020     201 10/04/2019    205  06/30/2019    203  01/05/2018    201 Wt 209 March 14, 2008 > 216 May 15, 2009 >  > 222 November 07, 2009 > 221 05/20/2011 > 11/04/2012 196 > 05/28/2013  204 > 11/24/2013  206 > 09/13/2015  199 > 12/18/2015  208 >          Vital signs reviewed  02/13/2022  - Note at rest 02 sats  99% on RA   General appearance:   amb pleasant wm nad      HEENT : Oropharynx  clear          NECK :  without  apparent JVD/ palpable Nodes/TM    LUNGS: no acc muscle use,  Nl contour chest which is clear to A and P bilaterally without cough on insp or exp maneuvers   CV:  RRR  no s3 or murmur or increase in P2, and mild serpiginous  vericosities L >R leg s  edema   ABD:  soft and nontender with nl inspiratory excursion in the supine position. No bruits or organomegaly appreciated   MS:  Nl gait/ ext warm without deformities Or obvious joint restrictions  calf tenderness, cyanosis or clubbing     SKIN: warm and dry with no rash  NEURO:  alert, approp, nl sensorium with  no motor or cerebellar deficits apparent.         Assessment & Plan:

## 2022-03-15 ENCOUNTER — Ambulatory Visit (INDEPENDENT_AMBULATORY_CARE_PROVIDER_SITE_OTHER): Payer: BC Managed Care – PPO | Admitting: Internal Medicine

## 2022-03-15 ENCOUNTER — Encounter: Payer: Self-pay | Admitting: Internal Medicine

## 2022-03-15 VITALS — BP 164/104 | HR 62 | Temp 98.2°F | Resp 14 | Ht 72.0 in | Wt 192.0 lb

## 2022-03-15 DIAGNOSIS — R42 Dizziness and giddiness: Secondary | ICD-10-CM | POA: Diagnosis not present

## 2022-03-15 DIAGNOSIS — Z91018 Allergy to other foods: Secondary | ICD-10-CM | POA: Insufficient documentation

## 2022-03-15 DIAGNOSIS — E785 Hyperlipidemia, unspecified: Secondary | ICD-10-CM

## 2022-03-15 DIAGNOSIS — I10 Essential (primary) hypertension: Secondary | ICD-10-CM | POA: Diagnosis not present

## 2022-03-15 DIAGNOSIS — B370 Candidal stomatitis: Secondary | ICD-10-CM

## 2022-03-15 DIAGNOSIS — Z23 Encounter for immunization: Secondary | ICD-10-CM

## 2022-03-15 DIAGNOSIS — K219 Gastro-esophageal reflux disease without esophagitis: Secondary | ICD-10-CM

## 2022-03-15 DIAGNOSIS — B3781 Candidal esophagitis: Secondary | ICD-10-CM

## 2022-03-15 HISTORY — DX: Allergy to other foods: Z91.018

## 2022-03-15 MED ORDER — LOSARTAN POTASSIUM 50 MG PO TABS: 50.0000 mg | ORAL_TABLET | Freq: Every day | ORAL | 3 refills | Status: DC

## 2022-03-15 MED ORDER — EPINEPHRINE 0.3 MG/0.3ML IJ SOAJ: 0.3000 mg | INTRAMUSCULAR | 5 refills | Status: DC | PRN

## 2022-03-15 NOTE — Patient Instructions (Addendum)
It was a pleasure seeing you today! I truly hope you feel like you received 5 star service and please let me know if there is anything I can improve.  Loralee Pacas, MD   Today the plan is... 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  4  Your blood pressure trend concerns me. I would like for you to buy/use a home cuff to check at least 4x a week. Your goal is <140/90  Bring your home cuff and your log of blood pressures with you to next visit. Update me in 2-3 weeks by mychart.    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 I explained that EpiPen is only to buy time to get to the hospital if he is having throat swelling shortness of breath.  I also gave a handout on the other types of anaphylaxis for which he could use the EpiPen  He declines allergy referral because he already had it done many times.   Orders: -     EPINEPHrine; Inject 0.3 mg into the muscle as needed for anaphylaxis.  Dispense: 1 each; Refill: 5  Need for shingles vaccine -     Varicella-zoster vaccine IM  Hyperlipidemia LDL goal <130 Assessment & Plan: He will come back for a future wellness visit to get this rechecked but I went ahead and said that based on the old numbers that I do not think he needs medication and I even predict that it will be better when it is rechecked due to his weight loss since 2021  Declines cholesterol lowering diet   POSTURAL  LIGHTHEADEDNESS  Laryngopharyngeal reflux Assessment & Plan: I advised him on this that it is only happening rare intermittent it is okay to just take a Pepcid Complete when it happens.  I told him that the berry flavor taste the best I recommend that he look for the triggers and let me know things like coffee caffeine chocolate tequila are all associated with this problem and it would be important to stop that medicine that trigger. I explained that the laryngeal pharyngeal reflux condition as well as the throat swelling with the Salary have me concerned that he might have a condition known as eosinophilic esophagitis which is an allergy of the esophagus that can be treated with liquid steroids on the esophagus but it would have to be diagnosed first and that would require a GI referral and this endoscopy with biopsies  Orders: -     Ambulatory referral to Gastroenterology  HYPERTENSION, BENIGN Assessment & Plan: Individualized Hypertension Management: Stable, NOT controlled ON losartan 25 BP Readings from Last 1 Encounters:  02/13/22 134/70    Goal blood pressure of less than 140/90 explained Resistant/secondary hypertension workup: not necessary in my opinion Current hypertension medications:       Sig   losartan (COZAAR) 25 MG tablet (Taking) Take 1 tablet (25 mg  total) by mouth daily.   sildenafil (VIAGRA) 50 MG tablet (Taking) Take 1 tablet (50 mg total) by mouth daily as needed for erectile dysfunction.       Adjust medications as follows: Increase losartan 25 to 50 mg daily if the home blood pressures are not coming in at less than 140/90 but wait and see if the home blood pressures are good before but bumping up the dose  Standardized hypertension counseling and management: Counseled him to limit: salt, alcohol, NSAIDS, excess body weight. Have explained risks of poor control are FUTURE stroke and heart attacks Encouragement for home blood pressure monitoring daily for the  next weeks and bring results Explained Red Flag symptoms for ER: if blood pressure over 180 AND new headache, shortness of breath, confusion, or chest discomfort See AFTER VISIT SUMMARY for addition educational information provided Offered to refill meds.   Orders: -     Losartan Potassium; Take 1 tablet (50 mg total) by mouth daily.  Dispense: 90 tablet; Refill: 3  Thrush of mouth and esophagus ([place]) Assessment & Plan: I suggested using spacer.    Epinephrine Auto-Injector What is this medication? EPINEPHRINE (ep i NEF rin) treats severe allergic reactions (anaphylaxis). It may also be used to treat sudden asthma attacks. It reduces the effects of an allergic reaction, such as trouble breathing or swelling of the face, lips, and throat. Call emergency services after injection. You may need additional treatment. This medicine may be used for other purposes; ask your health care provider or pharmacist if you have questions. COMMON BRAND NAME(S): Adrenaclick, Auvi-Q, Epinephrine Professional EMS, Epinephrine Professional with Safety Seal, epinephrinesnap, epinephrinesnap-v, EpiPen, Epipen Jr, EPIsnap Epinephrine, SYMJEPI, Twinject What should I tell my care team before I take this medication? They need to know if you have any of the following conditions: Diabetes (high blood sugar) Glaucoma Heart disease High blood pressure Kidney disease Parkinson disease Pheochromocytoma Thyroid disease An unusual or allergic reaction to epinephrine, other medications, foods, dyes, or preservatives Pregnant or trying to get pregnant Breast-feeding How should I use this medication? This medication is injected into a muscle or under the skin. You will be taught how to prepare and give it. Take it as directed on the prescription label. Do not use it more often than directed. It is important that you put your used needles and syringes in a special sharps container. Do not put them in a trash can. If you  do not have a sharps container, call your pharmacist or care team to get one. This medication comes with INSTRUCTIONS FOR USE. Ask your pharmacist for directions on how to use this medication. Read the information carefully. Talk to your pharmacist or care team if you have questions. Talk to your care team about the use of this medication in children. While it may be prescribed for selected conditions, precautions do apply. Overdosage: If you think you have taken too much of this medicine contact a poison control center or emergency room at once. NOTE: This medicine is only for you. Do not share this medicine with others. What if I miss a dose? This does not apply. This medication is not for regular use. It should only be used as needed. What may interact with this medication? Do not take this medication with any of the following: General anesthetics, such as desflurane, isoflurane, sevoflurane This medication may also interact with the following: Antihistamines for allergy, cough, and cold Certain medications for blood pressure, heart disease, irregular heart beat Certain  medications for mental health conditions Certain medications for Parkinson disease, such as entacapone Digoxin Diuretics Doxapram Ergot alkaloids, such as dihydroergotamine, ergonovine, ergotamine, methylergonovine Levothyroxine MAOIs, such as Marplan, Nardil, and Parnate Oxytocin Phenothiazines, such as chlorpromazine, prochlorperazine, thioridazine Steroid medications, such as prednisone or cortisone Theophylline This list may not describe all possible interactions. Give your health care provider a list of all the medicines, herbs, non-prescription drugs, or dietary supplements you use. Also tell them if you smoke, drink alcohol, or use illegal drugs. Some items may interact with your medicine. What should I watch for while using this medication? Visit your care team for regular checks on your progress. Tell your care  team if your symptoms do not start to get better or if they get worse. Call emergency services if you have trouble breathing. What side effects may I notice from receiving this medication? Side effects that you should report to your care team as soon as possible: Allergic reactions--skin rash, itching, hives, swelling of the face, lips, tongue, or throat Heart attack--pain or tightness in the chest, shoulders, arms, or jaw, nausea, shortness of breath, cold or clammy skin, feeling faint or lightheaded Heart rhythm changes--fast or irregular heartbeat, dizziness, feeling faint or lightheaded, chest pain, trouble breathing Kidney injury--decrease in the amount of urine, swelling of the ankles, hands, or feet Pain, redness, or irritation at injection site Side effects that usually do not require medical attention (report to your care team if they continue or are bothersome): Anxiety, nervousness Dizziness Headache Heart palpitations--rapid, pounding, or irregular heartbeat Muscle weakness Nausea Pale skin, loss of color in lining of the eyelids, inner mouth, or nails Sweating Tremors or shaking Vomiting This list may not describe all possible side effects. Call your doctor for medical advice about side effects. You may report side effects to FDA at 1-800-FDA-1088. Where should I keep my medication? Keep out of the reach of children and pets. Store at room temperature between 20 and 25 degrees C (68 and 77 degrees F). Protect from light. Get rid of any unused medication after the expiration date. To get rid of medications that are no longer needed or have expired: Take the medication to a medication take-back program. Check with your pharmacy or law enforcement to find a location. If you cannot return the medication, ask your pharmacist or care team how to get rid of this medication safely. NOTE: This sheet is a summary. It may not cover all possible information. If you have questions about  this medicine, talk to your doctor, pharmacist, or health care provider.  2023 Elsevier/Gold Standard (2020-05-03 00:00:00)    Anaphylactic Reaction, Adult An anaphylactic reaction (anaphylaxis) is a sudden, severe allergic reaction by the body's disease-fighting system (immune system). Anaphylaxis can be life-threatening. This condition must be treated right away. Sometimes a person may need to be treated in the hospital. What are the causes? This condition is caused by exposure to a substance that you are allergic to (allergen). In response to this exposure, the body releases proteins (antibodies) and other compounds, such as histamine, into the bloodstream. This causes swelling in certain tissues and loss of blood pressure to important areas, such as the heart and lungs. Common allergens that can cause anaphylaxis include: Foods, especially peanuts, wheat, shellfish, milk, and eggs. Medicines. Insect bites or stings. Blood or parts of blood received for treatment (transfusions). Chemicals, such as dyes, latex, and contrast material that is used for medical tests. What increases the risk? This condition is more likely  to occur in people who: Have allergies. Have had anaphylaxis before. Have a family history of anaphylaxis. Have certain medical conditions, including asthma and eczema. What are the signs or symptoms? Symptoms of anaphylaxis may include: Feeling warm in the face (flushed). This may include redness. Itchy, red, swollen areas of skin (hives). Swelling of the eyes, lips, face, mouth, tongue, or throat. Difficulty breathing, speaking, or swallowing. Dizziness, light-headedness, or fainting. Pain or cramping in the abdomen. Vomiting or diarrhea. How is this diagnosed? This condition is diagnosed based on: Your symptoms. A physical exam. Blood tests. Recent history of exposure to allergens. How is this treated? If you think you are having an anaphylactic reaction, you  should do the following right away: Give yourself an epinephrine injection using what is commonly called an auto-injector "pen" (pre-filled automatic epinephrine injection device). Your health care provider will teach you how to use an auto-injector pen. Call for emergency help. If you use a pen, you must still get emergency medical treatment in the hospital. Treatment in the hospital may include: Medicines to help: Tighten your blood vessels (epinephrine). Relieve itching and hives (antihistamines). Reduce swelling (corticosteroids). Oxygen therapy to help you breathe. IV fluids to keep you hydrated. Follow these instructions at home: Safety Always keep an auto-injector pen near you. This can be lifesaving if you have a severe anaphylactic reaction. Use your auto-injector pen as told by your health care provider. Do not drive after an anaphylactic reaction until your health care provider approves. Make sure that you, the members of your household, and your employer know: What you are allergic to, so it can be avoided. How to use an auto-injector pen to give you an epinephrine injection. Replace your epinephrine immediately after you use your auto-injector pen. This is important if you have another reaction. If possible, carry two epinephrine auto-injector pens. If told by your health care provider, wear a medical alert bracelet or necklace that states your allergy. Learn the signs of anaphylaxis so that you can recognize and treat it right away. Work with your health care providers to make an anaphylaxis plan. Preparation is important. General instructions Tell all your health care providers that you have an allergy. If you have hives or rash: Use an over-the-counter antihistamine as told by your health care provider. Apply cold, wet cloths (cold compresses) to your skin or take baths or showers in cool water. Avoid hot water. Take over-the-counter and prescription medicines only as told by  your health care provider. Keep all follow-up visits as told by your health care provider. This is important. How is this prevented? Avoid allergens that have caused an anaphylactic reaction in the past. When you are at a restaurant, tell your server that you have an allergy. If you are not sure whether a menu item contains an ingredient that you are allergic to, ask your server. Where to find more information American Academy of Allergy, Asthma and Immunology: aaaai.org American Academy of Pediatrics: healthychildren.org Get help right away if: You develop symptoms of an allergic reaction. You may notice them soon after you are exposed to a substance. You used epinephrine. You need more medical care even if the medicine seems to be working. This is important because anaphylaxis may happen again within 72 hours (rebound anaphylaxis). You also may need more doses of epinephrine. These symptoms may represent a serious problem that is an emergency. Do not wait to see if the symptoms will go away. Do the following right away: Use the auto-injector  pen as you have been instructed. Get medical help. Call your local emergency services (911 in the [place] Do not drive yourself to the hospital. Summary An anaphylactic reaction (anaphylaxis) is a sudden, severe allergic reaction by the body's disease-fighting system (immune system). This condition can be life-threatening. If you have an anaphylactic reaction, get medical help right away. Your health care provider may teach you how to use an auto-injector "pen" (pre-filled automatic epinephrine injection device) to give yourself a shot. Always keep an auto-injector pen with you. This could save your life. Use it as told by your health care provider. If you use epinephrine, you must still get emergency medical treatment, even if the medicine seems to be working. This information is not intended to replace advice given to you by your health care provider. Make  sure you discuss any questions you have with your health care provider. Document Revised: 06/01/2020 Document Reviewed: 06/01/2020 Elsevier Patient Education  [place].            '[x]'$  RETURN TO CLINIC: No follow-ups on file.   - If you are not doing well: RETURN to the office sooner. - Please bring all your medicines to each appointment.  - If your condition begins to worsen or become severe:  GO to the ER.  '[x]'$  QUESTIONS/CONCERNS:  If you have follow-up questions / concerns:  - CLINICAL: please contact me via phone [phone] OR MyChart messaging  - [place] you will be contacted with the lab results as soon as they are available. For any labs or imaging tests, we will call you if the results are significantly abnormal.  Most normal results will be posted to myChart as soon as they are available and I will comment on them there within 2-3 business days.  The fastest way to get your results is to activate your My Chart account. Instructions are located on the last page of this paperwork. If you have not heard from [place] regarding the results in 2 weeks, please contact this office.  - BILLING: xray and lab orders are billed from separate companies and questions./concerns should be directed to the [facility].  For visit charges please discuss with our administrative services

## 2022-03-15 NOTE — Assessment & Plan Note (Signed)
He will come back for a future wellness visit to get this rechecked but I went ahead and said that based on the old numbers that I do not think he needs medication and I even predict that it will be better when it is rechecked due to his weight loss since 2021  Declines cholesterol lowering diet

## 2022-03-15 NOTE — Progress Notes (Signed)
[facility]  Phone: [phone]  New patient visit  Visit Date: 03/15/2022 Patient: Jose Crane   DOB: 1965-05-26   56 y.o. Male  MRN: [id number]  Today's healthcare provider: Loralee Pacas, MD  Assessment and Plan:   Jose Crane was seen today for establish care, discuss vaccine, difficulty with swallowing and nausea and vomiting.  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   Also has history of epinephrine requiring asthma.   Assessment & Plan: I am not sure if it is celery allergy or something else I recommend that he meet with an allergist and get full allergy testing possibly and I am getting give him an EpiPen to have available in the meantime and he will avoid celery. I explained that EpiPen is only to buy time to get to the hospital if he is having throat swelling shortness of breath.  I also gave a handout on the other types of anaphylaxis for which he could use the EpiPen  He declines allergy referral because he already had it done many times.   Orders: -     EPINEPHrine; Inject 0.3 mg into the muscle as needed for anaphylaxis.  Dispense: 1 each; Refill: 5  Need for shingles vaccine -     Varicella-zoster vaccine IM  Hyperlipidemia LDL goal <130 Overview: Lipid Panel     Component Value Date/Time   CHOL 229 (H) 10/04/2019 1000   TRIG 123.0 10/04/2019 1000   HDL 56.90 10/04/2019 1000   CHOLHDL 4 10/04/2019 1000   VLDL 24.6 10/04/2019 1000   LDLCALC 148 (H) 10/04/2019 1000   LDLDIRECT 165.9 05/28/2013 0946   2021 Trying to maintain healthy weight with tennis    - Goal LDL < 130 as no fm hx known  Assessment & Plan: He will come back for a future wellness visit to get this rechecked but I went ahead and said that based on the old numbers that I do not think he needs medication and I even predict that it  will be better when it is rechecked due to his weight loss since 2021  Declines cholesterol lowering diet   POSTURAL LIGHTHEADEDNESS Overview: See ER visit [facility] 2023 Has persisted Associated with antihypertensives not monitoring BP at home   Laryngopharyngeal reflux Assessment & Plan: I advised him on this that it is only happening rare intermittent it is okay to just take a Pepcid Complete when it happens.  I told him that the berry flavor taste the best I recommend that he look for the triggers and let me know things like coffee caffeine chocolate tequila are all associated with this problem and it would be important to stop that medicine that trigger. I explained that the laryngeal pharyngeal reflux condition as well as the throat swelling with the Salary have me concerned that he might have a condition known as eosinophilic esophagitis which is an allergy of the esophagus that can be treated with liquid steroids on the esophagus but it would have to be diagnosed first and that would require a GI referral and this endoscopy with biopsies  Orders: -     Ambulatory referral to Gastroenterology  HYPERTENSION, BENIGN Overview: Taking just losartan now history of postural dizziness and weight loss   Assessment & Plan: Individualized Hypertension Management: Stable, NOT controlled ON losartan 25 BP Readings  from Last 1 Encounters:  02/13/22 134/70    Goal blood pressure of less than 140/90 explained Resistant/secondary hypertension workup: not necessary in my opinion Current hypertension medications:       Sig   losartan (COZAAR) 25 MG tablet (Taking) Take 1 tablet (25 mg total) by mouth daily.   sildenafil (VIAGRA) 50 MG tablet (Taking) Take 1 tablet (50 mg total) by mouth daily as needed for erectile dysfunction.       Adjust medications as follows: Increase losartan 25 to 50 mg daily if the home blood pressures are not coming in at less than 140/90 but wait and  see if the home blood pressures are good before but bumping up the dose  Standardized hypertension counseling and management: Counseled him to limit: salt, alcohol, NSAIDS, excess body weight. Have explained risks of poor control are FUTURE stroke and heart attacks Encouragement for home blood pressure monitoring daily for the next weeks and bring results Explained Red Flag symptoms for ER: if blood pressure over 180 AND new headache, shortness of breath, confusion, or chest discomfort See AFTER VISIT SUMMARY for addition educational information provided Offered to refill meds.   Orders: -     Losartan Potassium; Take 1 tablet (50 mg total) by mouth daily.  Dispense: 90 tablet; Refill: 3  Thrush of mouth and esophagus ([place]) Overview: On and off, but hasn't came for a while Seconday to severe asthma inhaler requirement-  Assessment & Plan: I suggested using spacer.       Today's visit is a problem focused visit, but preventive care maintenance concerns were considered by the physician in deciding follow up :   Subjective:  Patient presents today to establish care.  Was following with Dr. Melvyn Novas as a pulmonologist who was managing his primary care needs at request for the last 20 years but requested he establish with a dedicated Primary Care Provider (PCP).    Problem-oriented charting was used to update the medical history: Problem  Allergy to Celery   Has had celery without allergy, but twice in past 20 yrs felt that throat closed up and he couldn't swallow (but was able to breathe for a few hours.   Also has history of epinephrine requiring asthma.    Laryngopharyngeal Reflux  Thrush of Mouth and Esophagus ([facility])   On and off, but hasn't came for a while Seconday to severe asthma inhaler requirement-   POSTURAL LIGHTHEADEDNESS   See ER visit [facility] 2023 Has persisted Associated with antihypertensives not monitoring BP at home   HYPERTENSION, BENIGN   Taking  just losartan now history of postural dizziness and weight loss    History of Deep Vein Thrombosis (Dvt) of Lower Extremity   DVT Left leg after achilles surgery July 2010  - Repeat doppler 04/10/09: Extensive chronic L Leg DVT  -repeat doppler 10/11/09---resolution of DVT in CFV and SFV, residual clot in popliteal and peroneal veins  -repeat doppler 09/03/2010 >>residual clot in left proximal popliteal vein. >>refer to vascular  -OV w/ Dr. Kellie Simmering 09/25/10  in Vascular w/ rec of possible trial off coumadin>>Jose Crane decided to stop coumadin      >>>Per phone conversation with Jose Crane on  02/19/2011 >>Jose Crane did not follow up with Dr. Melvyn Novas  As recommended by Dr. Kellie Simmering to discuss            Coumadin , he stopped on 11/28/10 and does not want to be on couamdin, Is taking an ASA daily. [facility] notified- [name],  RN. Jose Crane advised of risks esp with immobility and travel. Jose Crane verbalized understanding.  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 - Positive for a superficial thrombus of the greater saphenous vein   coursing from about 3 inches above the ankle to three inches   below the knee. 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 - Refer back to Dr Kellie Simmering  02/01/2016 >  Diona Fanti daily    Hyperlipidemia Ldl Goal <130   Lipid Panel     Component Value Date/Time   CHOL 229 (H) 10/04/2019 1000   TRIG 123.0 10/04/2019 1000   HDL 56.90 10/04/2019 1000   CHOLHDL 4 10/04/2019 1000   VLDL 24.6 10/04/2019 1000   LDLCALC 148 (H) 10/04/2019 1000   LDLDIRECT 165.9 05/28/2013 0946   2021 Trying to maintain healthy weight with tennis    - Goal LDL < 130 as no fm hx known   Throat Pain in Adult (Resolved)  Acute Sore Throat (Resolved)   Onset late feb 2021  > min erythema on exam 06/30/2019    Anterior Cervical Adenopathy (Resolved)   S/p dental  procedure L molar 07/2015    Health Care Maintenance (Resolved)   Followed as [place] [facility] given 11/24/13  - referred for colonoscopy 11/14/2017      Contact Dermatitis (Resolved)  INJURY, LEG (Resolved)   Followed as Primary Care Patient/ [facility]/ [name]  - hit by baseball around Jul1 2014> L shin hematoma    INFLUENZA, WITH RESPIRATORY SYMPTOMS (Resolved)   Qualifier: Diagnosis of  By: Elsworth Soho MD, Leanna Sato        Depression Screen     No data to display         No results found for any visits on 03/15/22.  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   Also has history of epinephrine requiring asthma.    Asthma    Clotting disorder ([place])    DVT (deep venous thrombosis) ([place]) 10/2008   Left leg; after achilles surgery; Repeat doppler 04-10-2009: Extensive chronic L leg DVT. Repeat Doppler 10-11-2009--resolution of DVT in CFV and SFV, residual clot in popliteal and peroneal veins   Hyperlipidemia    Hypertension    Ruptured, tendon, Achilles 07/2008   Past Surgical History:  Procedure Laterality Date   ACHILLES TENDON REPAIR Bilateral    2003 right, left 2010   Family History  Adopted: Yes   Outpatient Medications Prior to Visit  Medication Sig Dispense Refill   albuterol (PROAIR HFA) 108 (90 Base) MCG/ACT inhaler INHALE 2 PUFFS INTO THE LUNGS EVERY 6 HOURS AS NEEDED FOR WHEEZING OR SHORTNESS OF BREATH 8.5 each 1   aspirin 325 MG tablet Take 325 mg by mouth daily.     budesonide-formoterol (SYMBICORT) 160-4.5 MCG/ACT inhaler INHALE 2 PUFFS BY MOUTH FIRST THING IN THE AM, THEN 2 PPUFFS ABOUT 12 HOURS LATER 30.6 each 9   sildenafil (VIAGRA) 50 MG tablet Take 1 tablet (50 mg  total) by mouth daily as needed for  erectile dysfunction. 10 tablet 5   losartan (COZAAR) 25 MG tablet Take 1 tablet (25 mg total) by mouth daily. 90 tablet 0   No facility-administered medications prior to visit.    No Known Allergies Social History   Tobacco Use   Smoking status: Never   Smokeless tobacco: Never  Vaping Use   Vaping Use: Never used  Substance Use Topics   Alcohol use: Yes    Alcohol/week: 4.0 - 5.0 standard drinks of alcohol    Types: 4 - 5 Standard drinks or equivalent per week   Drug use: Yes    Types: Marijuana    Comment: 2 days ago    Immunization History  Administered Date(s) Administered   Influenza Whole 01/27/2009   Pneumococcal Conjugate-13 11/24/2013   Tdap 05/20/2011   Zoster Recombinat (Shingrix) 03/15/2022    Objective:  BP (!) 164/104 (BP Location: Right Arm, Patient Position: Sitting)   Pulse 62   Temp 98.2 F (36.8 C) (Temporal)   Resp 14   Ht 6' (1.829 m)   Wt 192 lb (87.1 kg)   SpO2 97%   BMI 26.04 kg/m  Body mass index is 26.04 kg/m.   Physical Exam   Results Reviewed: Results for orders placed or performed during the hospital encounter of 02/10/22  Lipase, blood  Result Value Ref Range   Lipase <10 (L) 11 - 51 U/L  Comprehensive metabolic panel  Result Value Ref Range   Sodium 131 (L) 135 - 145 mmol/L   Potassium 3.6 3.5 - 5.1 mmol/L   Chloride 96 (L) 98 - 111 mmol/L   CO2 24 22 - 32 mmol/L   Glucose, Bld 117 (H) 70 - 99 mg/dL   BUN 17 6 - 20 mg/dL   Creatinine, Ser 1.03 0.61 - 1.24 mg/dL   Calcium 10.0 8.9 - 10.3 mg/dL   Total Protein 8.1 6.5 - 8.1 g/dL   Albumin 4.2 3.5 - 5.0 g/dL   AST 26 15 - 41 U/L   ALT 23 0 - 44 U/L   Alkaline Phosphatase 65 38 - 126 U/L   Total Bilirubin 0.8 0.3 - 1.2 mg/dL   GFR, Estimated >60 >60 mL/min   Anion gap 11 5 - 15  CBC  Result Value Ref Range   WBC 7.8 4.0 - 10.5 K/uL   RBC 4.93 4.22 - 5.81 MIL/uL   Hemoglobin 14.4 13.0 - 17.0 g/dL   HCT 41.6 39.0 - 52.0 %   MCV 84.4  80.0 - 100.0 fL   MCH 29.2 26.0 - 34.0 pg   MCHC 34.6 30.0 - 36.0 g/dL   RDW 12.2 11.5 - 15.5 %   Platelets 254 150 - 400 K/uL   nRBC 0.0 0.0 - 0.2 %  Urinalysis, Routine w reflex microscopic Urine, Clean Catch  Result Value Ref Range   Color, Urine YELLOW YELLOW   APPearance CLEAR CLEAR   Specific Gravity, Urine 1.016 1.005 - 1.030   pH 5.5 5.0 - 8.0   Glucose, UA NEGATIVE NEGATIVE mg/dL   Hgb urine dipstick MODERATE (A) NEGATIVE   Bilirubin Urine NEGATIVE NEGATIVE   Ketones, ur TRACE (A) NEGATIVE mg/dL   Protein, ur TRACE (A) NEGATIVE mg/dL   Nitrite NEGATIVE NEGATIVE   Leukocytes,Ua NEGATIVE NEGATIVE   RBC / HPF 0-5 0 - 5 RBC/hpf   WBC, UA 0-5 0 - 5 WBC/hpf  Troponin I (High Sensitivity)  Result Value Ref Range   Troponin I (High Sensitivity) 5 <18 ng/L  Troponin I (  High Sensitivity)  Result Value Ref Range   Troponin I (High Sensitivity) 4 <18 ng/L

## 2022-03-15 NOTE — Assessment & Plan Note (Signed)
I advised him on this that it is only happening rare intermittent it is okay to just take a Pepcid Complete when it happens.  I told him that the berry flavor taste the best I recommend that he look for the triggers and let me know things like coffee caffeine chocolate tequila are all associated with this problem and it would be important to stop that medicine that trigger. I explained that the laryngeal pharyngeal reflux condition as well as the throat swelling with the Salary have me concerned that he might have a condition known as eosinophilic esophagitis which is an allergy of the esophagus that can be treated with liquid steroids on the esophagus but it would have to be diagnosed first and that would require a GI referral and this endoscopy with biopsies

## 2022-03-15 NOTE — Assessment & Plan Note (Signed)
Individualized Hypertension Management: Stable, NOT controlled ON losartan 25 BP Readings from Last 1 Encounters:  02/13/22 134/70    Goal blood pressure of less than 140/90 explained Resistant/secondary hypertension workup: not necessary in my opinion Current hypertension medications:       Sig   losartan (COZAAR) 25 MG tablet (Taking) Take 1 tablet (25 mg total) by mouth daily.   sildenafil (VIAGRA) 50 MG tablet (Taking) Take 1 tablet (50 mg total) by mouth daily as needed for erectile dysfunction.       Adjust medications as follows: Increase losartan 25 to 50 mg daily if the home blood pressures are not coming in at less than 140/90 but wait and see if the home blood pressures are good before but bumping up the dose  Standardized hypertension counseling and management: Counseled him to limit: salt, alcohol, NSAIDS, excess body weight. Have explained risks of poor control are FUTURE stroke and heart attacks Encouragement for home blood pressure monitoring daily for the next weeks and bring results Explained Red Flag symptoms for ER: if blood pressure over 180 AND new headache, shortness of breath, confusion, or chest discomfort See AFTER VISIT SUMMARY for addition educational information provided Offered to refill meds.

## 2022-03-15 NOTE — Assessment & Plan Note (Signed)
I am not sure if it is celery allergy or something else I recommend that he meet with an allergist and get full allergy testing possibly and I am getting give him an EpiPen to have available in the meantime and he will avoid celery. I explained that EpiPen is only to buy time to get to the hospital if he is having throat swelling shortness of breath.  I also gave a handout on the other types of anaphylaxis for which he could use the EpiPen  He declines allergy referral because he already had it done many times.

## 2022-03-15 NOTE — Assessment & Plan Note (Signed)
I suggested using spacer.

## 2022-03-29 ENCOUNTER — Encounter: Payer: Self-pay | Admitting: Internal Medicine

## 2022-03-29 ENCOUNTER — Ambulatory Visit (INDEPENDENT_AMBULATORY_CARE_PROVIDER_SITE_OTHER): Payer: BC Managed Care – PPO | Admitting: Internal Medicine

## 2022-03-29 VITALS — BP 130/90 | HR 64 | Temp 98.3°F | Resp 12 | Ht 72.0 in | Wt 187.2 lb

## 2022-03-29 DIAGNOSIS — Z0000 Encounter for general adult medical examination without abnormal findings: Secondary | ICD-10-CM

## 2022-03-29 DIAGNOSIS — Z9109 Other allergy status, other than to drugs and biological substances: Secondary | ICD-10-CM

## 2022-03-29 DIAGNOSIS — I10 Essential (primary) hypertension: Secondary | ICD-10-CM

## 2022-03-29 DIAGNOSIS — Z119 Encounter for screening for infectious and parasitic diseases, unspecified: Secondary | ICD-10-CM

## 2022-03-29 LAB — COMPREHENSIVE METABOLIC PANEL
ALT: 16 U/L (ref 0–53)
AST: 17 U/L (ref 0–37)
Albumin: 4.6 g/dL (ref 3.5–5.2)
Alkaline Phosphatase: 48 U/L (ref 39–117)
BUN: 20 mg/dL (ref 6–23)
CO2: 29 mEq/L (ref 19–32)
Calcium: 10 mg/dL (ref 8.4–10.5)
Chloride: 101 mEq/L (ref 96–112)
Creatinine, Ser: 1.22 mg/dL (ref 0.40–1.50)
GFR: 67.3 mL/min (ref >60.00)
Glucose, Bld: 97 mg/dL (ref 70–99)
Potassium: 4.4 mEq/L (ref 3.5–5.1)
Sodium: 137 mEq/L (ref 135–145)
Total Bilirubin: 0.9 mg/dL (ref 0.2–1.2)
Total Protein: 7.2 g/dL (ref 6.0–8.3)

## 2022-03-29 LAB — CBC
HCT: 48.1 % (ref 39.0–52.0)
Hemoglobin: 16.1 g/dL (ref 13.0–17.0)
MCHC: 33.4 g/dL (ref 30.0–36.0)
MCV: 86.2 fl (ref 78.0–100.0)
Platelets: 241 10*3/uL (ref 150.0–400.0)
RBC: 5.58 Mil/uL (ref 4.22–5.81)
RDW: 14.5 % (ref 11.5–15.5)
WBC: 6.4 10*3/uL (ref 4.0–10.5)

## 2022-03-29 LAB — LIPID PANEL
Cholesterol: 248 mg/dL — ABNORMAL HIGH (ref 0–200)
HDL: 64.1 mg/dL (ref >39.00)
LDL Cholesterol: 167 mg/dL — ABNORMAL HIGH (ref 0–99)
NonHDL: 183.59
Total CHOL/HDL Ratio: 4
Triglycerides: 83 mg/dL (ref 0.0–149.0)
VLDL: 16.6 mg/dL (ref 0.0–40.0)

## 2022-03-29 MED ORDER — LOSARTAN POTASSIUM 50 MG PO TABS: 75.0000 mg | ORAL_TABLET | Freq: Every day | ORAL | 3 refills | Status: DC

## 2022-03-29 NOTE — Patient Instructions (Addendum)
It was a pleasure seeing you today!  Your health and satisfaction are my top priorities. If you believe your experience today was worthy of a 5-star rating, I'd be grateful for your feedback! Loralee Pacas, MD   AT CHECKOUT:  '[]'$    Sign release of information at the check out desk for: Any records we need for your care and to be your medical home  '[]'$    Schedule next appointment:  Return in about 6 months (around 09/28/2022).  If you are not doing well:  Return to the office sooner  Please bring all your medicines to each appointment If your condition begins to worsen or become severe:  GO to the ER   CLINICAL PLAN REMINDERS: Your checklist to help you remember today's clinical plan  '[]'$    Goal blood pressure is 135/85.  You are very near the goal so keep an eye on it.  '[]'$   (Optional):  Review your clinical notes on MyChart after they are completed.     Today's draft of the physician documented plan for today's visit: (final revisions will be visible on MyChart chart later) Allergy to metal  Hypertension, unspecified type -     Losartan Potassium; Take 1.5 tablets (75 mg total) by mouth daily.  Dispense: 135 tablet; Refill: 3 -     CBC; Future -     Comprehensive metabolic panel; Future -     Lipid panel; Future  Screening examination for infectious disease -     Hepatitis C antibody; Future -     HIV Antibody (routine testing w rflx)  Preventative health care     QUESTIONS & CONCERNS: CLINICAL: please contact me via phone [phone] OR MyChart messaging  LAB & IMAGING:   We will call you if the results are significantly abnormal or you don't use MyChart.  Most normal results will be posted to MyChart immediately and have a clinical review message by Dr. Randol Kern posted within 2-3 business days.   If you have not heard from [place] regarding the results in 2 weeks OR if you need priority reporting, please contact this office. MYCHART:  The fastest way to get your results and  easiest way to stay in touch with [place] is by activating your My Chart account. Instructions are located on the last page of this paperwork.  BILLING: xray and lab orders are billed from separate companies and questions./concerns should be directed to the [facility].  For visit charges please discuss with our administrative services COMPLAINTS:  please let Dr. Randol Kern know or see the [facility], by asking at the front desk: we want you to be satisfied with every experience and we would be grateful for the opportunity to address any problems

## 2022-03-29 NOTE — Assessment & Plan Note (Signed)
Encouraged goal blood pressure 135/85 due to unknown family history and history of overweight and ascvd 6.5% on calc Increase losartan 50 to 75 Diet and exercise encouraged

## 2022-03-29 NOTE — Progress Notes (Signed)
[place] [facility] [employer]: [phone] Provider: Loralee Pacas, MD   Chief Complaint:  Jose Crane is a 56 y.o. assigned male at birth who presents today for his annual comprehensive physical exam.    Medical assistant also reports: Chief Complaint  Patient presents with   Annual Exam    Assessment/Crane:   Jose Crane was seen today for annual exam.  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  Dispense: 135 tablet; Refill: 3 -     CBC; Future -     Comprehensive metabolic panel; Future -     Lipid panel; Future  Screening examination for infectious disease -     Hepatitis C antibody; Future -     HIV Antibody (routine testing w rflx)      Today's Health Maintenance Counseling and Anticipatory Guidance:   Eye exams:  every 1-2 years recommended.  Having vision corrected can improve the quality of day-to-day life.  Eye specialists can detect certain eye conditions such as cataracts, glaucoma and age-related macular degeneration, which could lead to sight loss.  They can also detect certain rare cancers and diabetes, among other things.  He got one 2 month(s)  ago with Dr. Nicki Crane Dental health: Discussed importance of regular tooth brushing, flossing, and dental visits q6 months.  Poor dentition can lead to serious medical problems - particularly problems with heart valves. Sinus health: Encourage sterile saline nasal misting sinus rinses daily for pollen, to reduce allergies and risk for sinus infections.   Cardiovascular Risk Factor Reduction:   Advised patient of need for regular exercise and diet rich and fruits and vegetables and healthy fats to reduce risk of heart attack and stroke. Avoid first- and second-hand smoke and stimulants.   Avoid extreme exercise- exercise in moderation (150 minutes per week is a good goal) Wt Readings from Last 3  Encounters:  03/29/22 187 lb 3.2 oz (84.9 kg)  03/15/22 192 lb (87.1 kg)  02/13/22 185 lb 12.8 oz (84.3 kg)  The 10-year ASCVD risk score (Arnett DK, et al., 2019) is: 6.4%   Values used to calculate the score:     Age: 64 years     Sex: Male     Is Non-Hispanic African American: No     Diabetic: No     Tobacco smoker: No     Systolic Blood Pressure: 633 mmHg     Is BP treated: Yes     HDL Cholesterol: 56.9 mg/dL     Total Cholesterol: 229 mg/dL  Body mass index is 25.39 kg/m. / does check his weight and pant sizes are coming down. He reports his diet consists of breakfast eggs and bacon oatmeal for lunch, doesn't add salt Advised to try to reduce saturated fats, increase vegetables- especially avocadoe, and also make sure to include fish and nuts.  He reports his exercise consists of not great in winter but treadmill 30 min many days, some dog walking and tennis, Explained current 150 minutes a week recommendations.  Safe heart rate 170 Health maintenance and immunizations reviewed and he was encouraged to complete anything that is due: Immunization History  Administered Date(s) Administered   Influenza Whole 01/27/2009   Pneumococcal Conjugate-13 11/24/2013   Tdap 05/20/2011   Zoster Recombinat (Shingrix) 03/15/2022   Health Maintenance Due  Topic Date Due   Hepatitis C Screening  Never done  DTaP/Tdap/Td (2 - Td or Tdap) 05/19/2021    Penile cancer screening: Asked about genital warts or tumors/abnormalities of penis. Recommended Gardasil if none present, or cryo ablation if present Testicular cancer screening:  Patient was advised to palpate testicles, scrotum, penis for masses and inform me of any.   Prostate cancer screening:  Denies family history of prostate cancer or hematospermia so too young for screening by current guidelines. Lab Results  Component Value Date   PSA 0.76 10/04/2019   PSA 1.08 01/05/2018       STD screening: testing offered today, but patient  declined as He considers themself to be low risk based on his sexual history - he reports risk very low   Sleep Apnea screening:  He  denies any significant problems with sleep quality or hypersomnolence or being advised that he has been told that he snores or has apnea.  Gets restful sleep just interrupted to go to bathroom Colon cancer screening:    Denies strong family history of colon cancer or blood in stool so no screening is indicated and he has negative colonoscopy 2014 and 2019 and next due 01/10/2023 encouraged him to get on schedule with Dr. Fuller Crane Skin cancer screening-  Advised regular sunscreen use. He denies worrisome, changing, or new skin lesions. Showed him pictures of melanomas for reference: . Substance use:  I discussed that my recommendation is total abstinence from all substances of abuse including smoke and 2nd hand smoke, alcohol, illicit drugs, smoking, inhalants, sugar.   Offered to assist with any use disorders or addictions.   Injury prevention: Discussed safety belts, safety helmets, smoke detectors. Return to care in 1 year for next preventative visit.   Loralee Pacas, MD     Subjective:   See problem-oriented charting in (overview sections of assessment & Crane) for updated chart information added to chronic problems for future tracking  He has no acute complaints today. Brings blood pressure data showing   Review of Systems  Constitutional:  Negative for chills, diaphoresis, fever, malaise/fatigue and weight loss.  HENT:  Negative for congestion, ear discharge, ear pain, hearing loss, nosebleeds, sinus pain, sore throat and tinnitus.   Eyes:  Negative for blurred vision, double vision, photophobia, pain, discharge and redness.  Respiratory:  Negative for cough, hemoptysis, sputum production, shortness of breath, wheezing and stridor.   Cardiovascular:  Negative for chest pain, palpitations, orthopnea, claudication, leg swelling and PND.  Gastrointestinal:   Positive for nausea (now resolved) and vomiting (now resolved). Negative for abdominal pain, blood in stool, constipation, diarrhea, heartburn and melena.  Genitourinary:  Negative for dysuria, flank pain, frequency, hematuria and urgency.  Musculoskeletal:  Negative for back pain, falls, joint pain, myalgias and neck pain.  Skin:  Negative for itching and rash.  Neurological:  Negative for dizziness, tingling, tremors, sensory change, speech change, focal weakness, seizures, loss of consciousness, weakness and headaches.  Endo/Heme/Allergies:  Negative for environmental allergies and polydipsia. Does not bruise/bleed easily.  Psychiatric/Behavioral:  Negative for depression, hallucinations, memory loss, substance abuse and suicidal ideas. The patient is not nervous/anxious and does not have insomnia.      I attest that I have reviewed and confirmed the patients current medications to meet the medication reconciliation requirement  Current Outpatient Medications  Medication Sig Dispense Refill   albuterol (PROAIR HFA) 108 (90 Base) MCG/ACT inhaler INHALE 2 PUFFS INTO THE LUNGS EVERY 6 HOURS AS NEEDED FOR WHEEZING OR SHORTNESS OF BREATH 8.5 each 1   aspirin 325  MG tablet Take 325 mg by mouth daily.     budesonide-formoterol (SYMBICORT) 160-4.5 MCG/ACT inhaler INHALE 2 PUFFS BY MOUTH FIRST THING IN THE AM, THEN 2 PPUFFS ABOUT 12 HOURS LATER 30.6 each 9   EPINEPHrine (EPIPEN 2-PAK) 0.3 mg/0.3 mL IJ SOAJ injection Inject 0.3 mg into the muscle as needed for anaphylaxis. 1 each 5   losartan (COZAAR) 50 MG tablet Take 1.5 tablets (75 mg total) by mouth daily. 135 tablet 3   sildenafil (VIAGRA) 50 MG tablet Take 1 tablet (50 mg total) by mouth daily as needed for erectile dysfunction. 10 tablet 5   No current facility-administered medications for this visit.  He struggling to get epipen. He is waiting to see Dr. About his throat.   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   Also has history of epinephrine requiring asthma.    Asthma    Clotting disorder ([place])    DVT (deep venous thrombosis) ([place]) 10/2008   Left leg; after achilles surgery; Repeat doppler 04-10-2009: Extensive chronic L leg DVT. 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   Vertigo, benign positional 10/09/2020   Laryngopharyngeal reflux 07/10/2019   Thrush of mouth and esophagus ([place]) 07/10/2019   Varicose veins of bilateral lower extremities with other complications 02/77/4128   POSTURAL LIGHTHEADEDNESS 05/15/2009   HYPERTENSION, BENIGN 04/14/2009   History of deep vein thrombosis (DVT) of lower extremity 03/21/2009   Hyperlipidemia LDL goal <130 02/10/2008   Moderate persistent chronic asthma without complication 78/67/6720   Past Surgical History:  Procedure Laterality Date   ACHILLES TENDON REPAIR Bilateral    2003 right, left 2010   Family History  Adopted: Yes   No Known Allergies Social History   Tobacco Use   Smoking status: Never   Smokeless tobacco: Never  Vaping Use   Vaping Use: Never used  Substance Use Topics   Alcohol use: Yes    Alcohol/week: 4.0 - 5.0 standard drinks of alcohol    Types: 4 - 5 Standard drinks or equivalent per week   Drug use: Yes    Types: Marijuana    Comment: 2 days ago           Objective:  Physical Exam: BP (!) 130/90 (BP Location: Right Arm, Patient Position:  Sitting)   Pulse 64   Temp 98.3 F (36.8 C) (Temporal)   Resp 12   Ht 6' (1.829 m)   Wt 187 lb 3.2 oz (84.9 kg)   SpO2 95%   BMI 25.39 kg/m   Body mass index is 25.39 kg/m. Wt Readings from Last 3 Encounters:  03/29/22 187 lb 3.2 oz (84.9 kg)  03/15/22 192 lb (87.1 kg)  02/13/22 185 lb 12.8 oz (84.3 kg)   Gen: NAD, resting comfortably HEENT: no scleral icterus or injection. No  thyromegaly noted.  CV: RRR with no murmurs appreciated Pulm: NWOB, CTAB with no crackles, wheezes, or rhonchi GI: Normal bowel sounds present. Soft, Nontender, Nondistended. MSK: no edema, cyanosis, or clubbing noted Skin: warm, dry Neuro: CN2-12 grossly intact. Strength 5/5 in upper and lower extremities. Reflexes symmetric and intact bilaterally.  Psych: Normal affect and thought content

## 2022-03-30 LAB — HEPATITIS C ANTIBODY: Hepatitis C Ab: NONREACTIVE

## 2022-03-30 LAB — HIV ANTIBODY (ROUTINE TESTING W REFLEX): HIV 1&2 Ab, 4th Generation: NONREACTIVE

## 2022-04-08 ENCOUNTER — Telehealth: Payer: Self-pay | Admitting: Internal Medicine

## 2022-04-08 NOTE — Telephone Encounter (Signed)
Those are not equivalent  - see if dulera 200 2bid or generic symbicort 160 are covered and if not do BREO 767 one click each am

## 2022-04-08 NOTE — Telephone Encounter (Signed)
Pt states that insurance will no longer be covering Symbicort and alternats are Breo, [place] or Fluticasone. Dr. Melvyn Novas please advise.

## 2022-04-09 NOTE — Telephone Encounter (Signed)
Usually you have to have tried and failed their alternative so that's why I would fill one prescription for BREO whether he stays on it or not, and if he does stay on it bring it back to office to document he tried it/ knows how to use correctly for insurance purposes

## 2022-04-09 NOTE — Telephone Encounter (Signed)
Called and updated patient and he states he will try the [facility] once he is almost out of his current Symbicort. He states he will call us before he runs out of medication. Nothing further needed

## 2022-04-09 NOTE — Telephone Encounter (Signed)
Called patient and spoke to patient about new inhaler. Pt states he has enough Symbicort to last the next 3 months. But he is asking is Dr Melvyn Novas ok changing him to [facility] or does he want him to continue the Symbicort cause if so there is an appeal process.  Please advise sir

## 2022-05-14 ENCOUNTER — Other Ambulatory Visit: Payer: Self-pay | Admitting: Internal Medicine

## 2022-06-04 ENCOUNTER — Telehealth: Payer: Self-pay | Admitting: Internal Medicine

## 2022-06-04 NOTE — Telephone Encounter (Signed)
Patient states: -Following OV in 03/15/22 he did not increase losartan from 25 to 50 mg as discussed with PCP  - Following OV on 03/29/22, PCP stated that since 50 mg was not working, he wanted to try 75 mg- Patient didn't pick up medication  - He has been taking losartan 25 mg until he ran out last week. NOW feels comfortable to try losartan 50 mg   Patient requests: -Confirmation from PCP if him starting the 50 mg of losartan is okay  - If it is, can he pick up his script at the pharmacy sent on 03/15/22 or the one sent in on 03/29/22.   Please Advise.

## 2022-06-05 NOTE — Telephone Encounter (Signed)
Called and informed pt, pt is calling pharmacy to get Rx

## 2022-06-12 ENCOUNTER — Ambulatory Visit (INDEPENDENT_AMBULATORY_CARE_PROVIDER_SITE_OTHER): Payer: BC Managed Care – PPO

## 2022-06-12 DIAGNOSIS — Z23 Encounter for immunization: Secondary | ICD-10-CM | POA: Diagnosis not present

## 2022-09-30 ENCOUNTER — Ambulatory Visit (INDEPENDENT_AMBULATORY_CARE_PROVIDER_SITE_OTHER): Payer: BC Managed Care – PPO | Admitting: Internal Medicine

## 2022-09-30 ENCOUNTER — Encounter: Payer: Self-pay | Admitting: Internal Medicine

## 2022-09-30 VITALS — BP 140/90 | HR 65 | Temp 97.6°F | Ht 72.0 in | Wt 187.0 lb

## 2022-09-30 DIAGNOSIS — K219 Gastro-esophageal reflux disease without esophagitis: Secondary | ICD-10-CM

## 2022-09-30 DIAGNOSIS — H8110 Benign paroxysmal vertigo, unspecified ear: Secondary | ICD-10-CM

## 2022-09-30 DIAGNOSIS — E785 Hyperlipidemia, unspecified: Secondary | ICD-10-CM | POA: Diagnosis not present

## 2022-09-30 DIAGNOSIS — I10 Essential (primary) hypertension: Secondary | ICD-10-CM

## 2022-09-30 DIAGNOSIS — J4540 Moderate persistent asthma, uncomplicated: Secondary | ICD-10-CM

## 2022-09-30 DIAGNOSIS — Z86718 Personal history of other venous thrombosis and embolism: Secondary | ICD-10-CM

## 2022-09-30 MED ORDER — LOSARTAN POTASSIUM 50 MG PO TABS: 100.0000 mg | ORAL_TABLET | Freq: Every day | ORAL | 3 refills | Status: DC

## 2022-09-30 MED ORDER — MECLIZINE HCL 25 MG PO TABS: 25.0000 mg | ORAL_TABLET | Freq: Three times a day (TID) | ORAL | 1 refills | Status: DC | PRN

## 2022-09-30 MED ORDER — ONDANSETRON HCL 4 MG PO TABS: 4.0000 mg | ORAL_TABLET | Freq: Three times a day (TID) | ORAL | 0 refills | Status: DC | PRN

## 2022-09-30 NOTE — Assessment & Plan Note (Addendum)
Patient reports never had a workup  Last event end of April- bending over and standing up, gave sweats and vomiting We briefly discussed the potential benefits of vestibular rehab but he expressed a preference to not move forward with my offer(s) to facilitate the proposed intervention(s) at this time. We will revisit this option if needed at future visits and  he is always encouraged to contact me if the situation changes or he changes his mind.

## 2022-09-30 NOTE — Assessment & Plan Note (Signed)
Patient reports well-controlled and will set up appointment with pulm

## 2022-09-30 NOTE — Assessment & Plan Note (Signed)
Patient reports occasional only, declined to address

## 2022-09-30 NOTE — Progress Notes (Signed)
[facility]: [phone]   Routine Medical Office Visit  Patient:  Jose Crane      Age: 57 y.o.       Sex:  male  Date:   09/30/2022 PCP:    Lula Olszewski, MD   Today's Healthcare Provider: Lula Olszewski, MD   Assessment and Plan:   Chronic disease monitoring and management visit. Patient stable with no particular concerns, although blood pressure uncontrolled. Patient may move away soon so chart was updated in preparation for that.   Based on Abridge AI conversational text extraction: Assessment and Plan    Hypertension: Elevated blood pressure readings at home and in clinic. Currently on Losartan 50mg  daily. -Increase Losartan to 100mg  daily (two 50mg  tablets), with flexibility to adjust dose based on home blood pressure readings. -Check blood pressure at home and adjust medication dose as needed.  Vertigo: Recurrent episodes, most recent in April. No formal workup done previously. -Prescribe Antivert and Zofran for symptomatic relief during episodes. -Consider vestibular rehab if symptoms worsen or become more frequent.  Hyperlipidemia: LDL elevated but HDL also high, resulting in a reasonable ratio. Patient prefers to manage without medication at this time. -Encourage dietary modifications including increased intake of fish, flaxseed, nuts, extra virgin olive oil, and avocados. -Revisit the need for cholesterol-lowering medication if future tests show worsening lipid profile or if coronary calcium score is high.  Coronary Artery Disease Risk: Patient interested in assessing cardiovascular risk due to lack of family medical history. -Order CT coronary calcium score to assess for early signs of coronary artery disease.  Asthma: Well-controlled, patient follows with pulmonology. -Continue current management plan.  Laryngopharyngeal Reflux (LPR): Occasional symptoms, patient does not wish to address at this time. -Continue current management  plan.  Deep Vein Thrombosis (DVT) history: No current anticoagulation other than aspirin 325mg  daily. -Continue current management plan.  General Health Maintenance / Followup Plans -Consider cholesterol check at future visit if patient agrees. -Follow-up in 6 months if patient is still in the area.      Based on updated problems and orders placed with problem based charting:  Assessment and Plan [name] was seen today for hypertension and 6 month follow-up.  HYPERTENSION, BENIGN Overview: Taking just losartan now history of postural dizziness and weight loss Interim history from September 30, 2022:    Home readings:  Blood pressure running 140's at home on losartan 50   Patient reports taking current medications consistently and not experiencing any significant associated side effects or symptoms. Current hypertension medications:       Sig   losartan (COZAAR) 50 MG tablet (Taking) Take 1.5 tablets (75 mg total) by mouth daily.   sildenafil (VIAGRA) 50 MG tablet (Taking) Take 1 tablet (50 mg total) by mouth daily as needed for erectile dysfunction.      Lab Results  Component Value Date   NA 137 03/29/2022   K 4.4 03/29/2022   CREATININE 1.22 03/29/2022   GFR 67.30 03/29/2022     Assessment & Plan: Reviewed available data from patient and  BP Readings from Last 3 Encounters:  09/30/22 (!) 149/93  03/29/22 (!) 130/90  03/15/22 (!) 164/104   My individualized, goal average blood pressure for this patient, after considering the evidence for and against aggressive blood pressure goals as well as their past medical history and preferences, is 140/90 In my medical opinion, this problem is stable, inadequately controlled  Increased medication(s) prescribed after collaborative review. Following informed consent, we  adjusted the medication regimen as per documented orders to now be:  Current hypertension medications:       Sig   losartan (COZAAR) 50 MG tablet (Taking) Take 1.5  tablets (75 mg total) by mouth daily.   sildenafil (VIAGRA) 50 MG tablet (Taking) Take 1 tablet (50 mg total) by mouth daily as needed for erectile dysfunction.     We explained the expected benefits and potential side effects of the new medications and encouraged the patient to report any concerns. Information for patient review: Please limit and avoid: salt, alcohol, NSAIDS, excess body weight, smoking, stress, sedentary lifestyles The risks of poor control over time are FUTURE stroke and heart attacks, but if you have a blood pressure over 180 and any red flag symptoms(headache/shortness of breath/confusion/chest discomfort) you should go to the ER.  You are encouraged to do home blood pressure monitoring, at least as many times per week as blood pressure medications you are on.  For example, bring a diary with 3 home blood pressure readings per week to each visit if you are on 3 blood pressure medications.   If you are on more than 3 medication(s) or have certain risk factors, we should do a resistant hypertension workup See AFTER VISIT SUMMARY for addition educational information provided Please let me know in advance when you need medication(s) refills, consistently taking your medication is very important!   Orders: -     CT CARDIAC SCORING ([facility] LOCATIONS ONLY); Future  Hyperlipidemia LDL goal <130 Overview: Medications: not yet discussed Lab Results  Component Value Date   HDL 64.10 03/29/2022   HDL 56.90 10/04/2019   HDL 55.20 01/05/2018   CHOLHDL 4 03/29/2022   CHOLHDL 4 10/04/2019   CHOLHDL 4 01/05/2018   Lab Results  Component Value Date   LDLCALC 167 (H) 03/29/2022   LDLCALC 148 (H) 10/04/2019   LDLCALC 126 (H) 01/05/2018   LDLDIRECT 165.9 05/28/2013   LDLDIRECT 117.5 05/21/2011   Lab Results  Component Value Date   TRIG 83.0 03/29/2022   TRIG 123.0 10/04/2019   TRIG 114.0 01/05/2018   Lab Results  Component Value Date   CHOL 248 (H) 03/29/2022   CHOL 229  (H) 10/04/2019   CHOL 204 (H) 01/05/2018   The 10-year ASCVD risk score (Arnett DK, et al., 2019) is: 8%   Values used to calculate the score:     Age: 53 years     Sex: Male     Is Non-Hispanic African American: No     Diabetic: No     Tobacco smoker: No     Systolic Blood Pressure: 149 mmHg     Is BP treated: Yes     HDL Cholesterol: 64.1 mg/dL     Total Cholesterol: 248 mg/dL Lab Results  Component Value Date   ALT 16 03/29/2022   AST 17 03/29/2022   ALKPHOS 48 03/29/2022   TSH 1.43 10/04/2019   Body mass index is 25.36 kg/m.  Lipoprotein(a), Apolipoprotein B (ApoB), and High-sensitivity C-reactive protein (hs-CRP) No results found for: "HSCRP", "LIPOA" Improving Your Cholesterol: Diet: Focus on a Mediterranean-style diet, limit saturated fats and sugars, and increase omega-3 fatty acids (fish, flaxseeds,nuts,extra virgin olive oil, avocados). Exercise: Engage in regular physical activity (aerobic exercises are particularly beneficial for HDL). Weight Management: Maintain a healthy weight. Smoking Cessation: Quitting smoking improves cholesterol levels.   Orders: -     CT CARDIAC SCORING ([facility] LOCATIONS ONLY); Future  Moderate persistent chronic asthma without complication Overview:  Followed in Pulmonary clinic/ [facility]/ [name]  Onset in childhood/ req mech vent 1997  09/03/2010 > decreased symbicort 80/4.69mcg  -  11/04/2012  > increased symbicort to 160 bid as not consistent with use  - 11/14/2017  After extensive coaching inhaler device  effectiveness =    90% > rec continue symb 160 2bid  - Spirometry 01/05/2018  FEV1 4.5 (111%)  Ratio 90 p no rx prior  - Allergy profile 01/05/18  >  Eos 0.5 /  IgE  598 RAST pos everything but mold  Assessment & Plan: Patient reports well-controlled and will set up appointment with pulm   Laryngopharyngeal reflux Overview: rare  Assessment & Plan: Patient reports occasional only, declined to address   Benign paroxysmal  positional vertigo, unspecified laterality Overview: Comes and goes  Assessment & Plan: Patient reports never had a workup  Last event end of April- bending over and standing up, gave sweats and vomiting We briefly discussed the potential benefits of vestibular rehab but he expressed a preference to not move forward with my offer(s) to facilitate the proposed intervention(s) at this time. We will revisit this option if needed at future visits and  he is always encouraged to contact me if the situation changes or he changes his mind.   Orders: -     Meclizine HCl; Take 1 tablet (25 mg total) by mouth 3 (three) times daily as needed for dizziness.  Dispense: 30 tablet; Refill: 1 -     Ondansetron HCl; Take 1 tablet (4 mg total) by mouth every 8 (eight) hours as needed for nausea or vomiting.  Dispense: 20 tablet; Refill: 0  Hypertension, unspecified type -     Losartan Potassium; Take 2 tablets (100 mg total) by mouth daily.  Dispense: 180 tablet; Refill: 3  History of deep vein thrombosis (DVT) of lower extremity Overview: DVT Left leg after achilles surgery July 2010 - he attributes to immobility post surgical. No coag workup done. - Repeat doppler 04/10/09: Extensive chronic L Leg DVT  -repeat doppler 10/11/09---resolution of DVT in CFV and SFV, residual clot in popliteal and peroneal veins  -repeat doppler 09/03/2010 >>residual clot in left proximal popliteal vein. >>refer to vascular  -OV w/ Dr. Hart Rochester 09/25/10  in Vascular w/ rec of possible trial off coumadin>>pt decided to stop coumadin      >>>Per phone conversation with pt on  02/19/2011 >>pt did not follow up with Dr. Sherene Sires  As recommended by Dr. Hart Rochester to discuss            Coumadin , he stopped on 11/28/10 and does not want to be on couamdin, Is taking an ASA daily. [facility] notified- Cloyde Reams, RN. Pt advised of risks esp with immobility and travel. Pt verbalized understanding.  - Full adult aspirin daily 05/20/11 - Venous  Doppler 05/22/11 Old L Popliteal DVT  - Venous dopplers on L  01/26/16 > No evidence of deep vein thrombosis involving the left lower extremity. - Positive for a superficial thrombus of the greater saphenous vein   coursing from about 3 inches above the ankle to three inches   below the knee. 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 - Refer back to Dr Hart Rochester  02/01/2016 >  Jonne Ply daily      Treatment plan discussed and reviewed in detail. Explained medication safety and potential side effects.  Answered all patient questions and confirmed understanding and comfort with the plan. Encouraged patient to contact  our office if they have any questions or concerns. Agreed on patient returning to office if symptoms worsen, persist, or new symptoms develop. Discussed precautions in case of needing to visit the Emergency Department.         Clinical Presentation:    56 y.o. male here today for Hypertension and 6 month follow-up  Based on [employer]bridge AI conversational text extraction:  Discussed the use of AI scribe software for clinical note transcription with the patient, who gave verbal consent to proceed.  History of Present Illness   The patient, with a history of hypertension, hyperlipidemia, deep vein thrombosis (DVT), asthma, varicose veins, laryngopharyngeal reflux (LPR), thrush, vertigo, and a celery allergy, presents for a six-month follow-up. The patient reports elevated blood pressure readings at home, typically ranging from 145 to 155 systolic. He is currently on Losartan, but the dosage may need adjustment to achieve better control.  The patient occasionally experiences reflux up to the throat but does not wish to address this issue at present. He denies any recent episodes of thrush, attributing past occurrences to his asthma inhalers. Asthma is reportedly well-controlled, and he is under the care of a pulmonologist.  The patient also reports intermittent episodes of vertigo,  the most recent of which occurred in April while he was bending over and standing up repeatedly. This episode was associated with sweating and vomiting. He has not had a formal workup for this vertigo and does not currently have any medication to manage these episodes.  The patient's DVT history is linked to a ruptured Achilles tendon, and he is currently on daily aspirin for anticoagulation. He has no known blood clotting disorders. He also reports a celery allergy, which previously caused his throat to close up.  His hyperlipidemia is managed without medication, and he expresses a desire to avoid additional medications if possible. He is open to lifestyle modifications, such as dietary changes, to manage his cholesterol levels. The patient is also considering a CT coronary calcium score test to assess for early cardiovascular disease, given his lack of family medical history due to being adopted.        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/2022   Resolved Ambulatory Problems    Diagnosis Date Noted   URI, ACUTE 03/14/2008   INFLUENZA, WITH RESPIRATORY SYMPTOMS 03/11/2008   INJURY, LEG 10/10/2009   Long term current use of anticoagulant 06/12/2010   Contact dermatitis 04/19/2011   Health care maintenance 05/28/2013   Anterior cervical adenopathy 09/15/2015   Acute sore throat 06/30/2019   Thrush of mouth and esophagus (HCC) 07/10/2019   Throat pain in adult 07/10/2019   Past Medical History:  Diagnosis Date    Allergy    Asthma    Clotting disorder (HCC)    DVT (deep venous thrombosis) (HCC) 10/2008   Hyperlipidemia    Hypertension    Ruptured, tendon, Achilles 07/2008    Outpatient Medications Prior to Visit  Medication Sig   albuterol (PROAIR HFA) 108 (90 Base) MCG/ACT inhaler INHALE 2 PUFFS INTO THE LUNGS EVERY 6 HOURS AS NEEDED FOR  WHEEZING OR SHORTNESS OF BREATH   aspirin 325 MG tablet Take 325 mg by mouth daily.   budesonide-formoterol (SYMBICORT) 160-4.5 MCG/ACT inhaler INHALE 2 PUFFS BY MOUTH FIRST THING IN THE AM, THEN 2 PPUFFS ABOUT 12 HOURS LATER   EPINEPHrine (EPIPEN 2-PAK) 0.3 mg/0.3 mL IJ SOAJ injection Inject 0.3 mg into the muscle as needed for anaphylaxis.   sildenafil (VIAGRA) 50 MG tablet Take 1 tablet (50 mg total) by mouth daily as needed for erectile dysfunction.   [DISCONTINUED] losartan (COZAAR) 50 MG tablet Take 1.5 tablets (75 mg total) by mouth daily.   No facility-administered medications prior to visit.         Clinical Data Analysis:   Physical Exam  BP (!) 140/90 (BP Location: Left Arm, Patient Position: Sitting)   Pulse 65   Temp 97.6 F (36.4 C) (Temporal)   Ht 6' (1.829 m)   Wt 187 lb (84.8 kg)   SpO2 97%   BMI 25.36 kg/m  Wt Readings from Last 10 Encounters:  09/30/22 187 lb (84.8 kg)  03/29/22 187 lb 3.2 oz (84.9 kg)  03/15/22 192 lb (87.1 kg)  02/13/22 185 lb 12.8 oz (84.3 kg)  02/10/22 190 lb 0.6 oz (86.2 kg)  12/04/21 190 lb (86.2 kg)  11/29/20 201 lb 6.4 oz (91.4 kg)  10/09/20 206 lb (93.4 kg)  10/04/20 210 lb (95.3 kg)  10/04/19 205 lb (93 kg)   Vital signs reviewed.  Nursing notes reviewed. Weight trend reviewed. General Appearance:  No acute distress appreciable.   Well-groomed, healthy-appearing male.  Well proportioned with no abnormal fat distribution.  Good muscle tone. Skin: Clear and well-hydrated. Pulmonary:  Normal work of breathing at rest, no respiratory distress apparent. SpO2: 97 %  Musculoskeletal: All extremities are  intact.  Neurological:  Awake, alert, oriented, and engaged.  No obvious focal neurological deficits or cognitive impairments.  Sensorium seems unclouded.   Speech is clear and coherent with logical content. Psychiatric:  Appropriate mood, pleasant and cooperative demeanor, thoughtful and engaged during the exam  Results Reviewed:    Lack of bloodwork noted since:  Latest Reference Range & Units 02/10/22 15:11 02/10/22 18:21 02/10/22 19:19 02/10/22 20:54 02/13/22 13:34 03/29/22 08:48  COMPREHENSIVE METABOLIC PANEL  Rpt !     Rpt  Sodium 135 - 145 mEq/L 131 (L)     137  Potassium 3.5 - 5.1 mEq/L 3.6     4.4  Chloride 96 - 112 mEq/L 96 (L)     101  CO2 19 - 32 mEq/L 24     29  Glucose 70 - 99 mg/dL 161 (H)     97  BUN 6 - 23 mg/dL 17     20  Creatinine 0.96 - 1.50 mg/dL 0.45     4.09  Calcium 8.4 - 10.5 mg/dL 81.1     91.4  Anion gap 5 - 15  11       Alkaline Phosphatase 39 - 117 U/L 65     48  Albumin 3.5 - 5.2 g/dL 4.2     4.6  Lipase 11 - 51 U/L <10 (L)       AST 0 - 37 U/L 26     17  ALT 0 - 53 U/L 23     16  Total Protein 6.0 - 8.3 g/dL 8.1     7.2  Total Bilirubin 0.2 - 1.2 mg/dL 0.8     0.9  GFR >78.29 mL/min  67.30  GFR, Estimated >60 mL/min >60       Troponin I (High Sensitivity) <18 ng/L 5   4    Total CHOL/HDL Ratio       4  Cholesterol 0 - 200 mg/dL      161 (H)  HDL Cholesterol >39.00 mg/dL      09.60  LDL (calc) 0 - 99 mg/dL      454 (H)  NonHDL       183.59  Triglycerides 0.0 - 149.0 mg/dL      09.8  VLDL 0.0 - 11.9 mg/dL      14.7  WBC 4.0 - 82.9 K/uL 7.8     6.4  RBC 4.22 - 5.81 Mil/uL 4.93     5.58  Hemoglobin 13.0 - 17.0 g/dL 56.2     13.0  HCT 86.5 - 52.0 % 41.6     48.1  MCV 78.0 - 100.0 fl 84.4     86.2  MCH 26.0 - 34.0 pg 29.2       MCHC 30.0 - 36.0 g/dL 78.4     69.6  RDW 29.5 - 15.5 % 12.2     14.5  Platelets 150.0 - 400.0 K/uL 254     241.0  nRBC 0.0 - 0.2 % 0.0       Hepatitis C Ab NON-REACTIVE       NON-REACTIVE  HIV NON-REACTIVE        NON-REACTIVE  URINALYSIS, ROUTINE W REFLEX MICROSCOPIC  Rpt !       Appearance CLEAR  CLEAR       Bilirubin Urine NEGATIVE  NEGATIVE       Color, Urine YELLOW  YELLOW       Glucose, UA NEGATIVE mg/dL NEGATIVE       Hgb urine dipstick NEGATIVE  MODERATE !       Ketones, ur NEGATIVE mg/dL TRACE !       Leukocytes,Ua NEGATIVE  NEGATIVE       Nitrite NEGATIVE  NEGATIVE       pH 5.0 - 8.0  5.5       Protein NEGATIVE mg/dL TRACE !       Specific Gravity, Urine 1.005 - 1.030  1.016       RBC / HPF 0 - 5 RBC/hpf 0-5       WBC, UA 0 - 5 WBC/hpf 0-5       DG CHEST 2 VIEW   Rpt      EKG 12-LEAD    Rpt     EKG      Rpt   !: Data is abnormal (L): Data is abnormally low (H): Data is abnormally high Rpt: View report in Results Review for more information    Signed: Lula Olszewski, MD 09/30/2022 7:05 PM

## 2022-09-30 NOTE — Patient Instructions (Addendum)
It was a pleasure seeing you today! Your health and satisfaction are our top priorities.   Glenetta Hew, MD VISIT SUMMARY:  During your recent visit, we discussed your hypertension, vertigo, hyperlipidemia, and interest in assessing your cardiovascular risk. Your asthma, laryngopharyngeal reflux, and history of deep vein thrombosis were also reviewed. You reported elevated blood pressure readings at home and occasional episodes of vertigo. You prefer to manage your hyperlipidemia without medication and are considering a CT coronary calcium score test to assess for early cardiovascular disease.  YOUR PLAN:  -HYPERTENSION: Hypertension is high blood pressure. We will increase your Losartan dosage to 100mg  daily to better control your blood pressure. You should continue to monitor your blood pressure at home and adjust the medication dose as needed.  -VERTIGO: Vertigo is a sensation of feeling off balance, often associated with a spinning sensation. We will prescribe Antivert and Zofran for relief during episodes. If symptoms worsen or become more frequent, we may consider vestibular rehab.  -HYPERLIPIDEMIA: Hyperlipidemia is high levels of fats (lipids) in the blood. We encourage you to modify your diet to include more fish, flaxseed, nuts, extra virgin olive oil, and avocados. We may revisit the need for cholesterol-lowering medication if future tests show a worsening lipid profile or if your coronary calcium score is high.  -CORONARY ARTERY DISEASE RISK: We will order a CT coronary calcium score test to assess for early signs of coronary artery disease, which is a condition where the major blood vessels that supply your heart with blood, oxygen, and nutrients become damaged or diseased.  INSTRUCTIONS:  Please monitor your blood pressure at home and adjust your Losartan dose as needed. Take the prescribed Antivert and Zofran during vertigo episodes. Modify your diet to manage your hyperlipidemia  and consider a cholesterol check at a future visit. We will follow up in 6 months if you are still in the area. Next Steps:  [x]  Early Intervention: Schedule sooner appointment, call our on-call services, or go to emergency room if there is Increase in pain or discomfort New or worsening symptoms Sudden or severe changes in your health [x]  Flexible Follow-Up: We recommend a No follow-ups on file. for optimal routine care. This allows for progress monitoring and treatment adjustments. [x]  Preventive Care: Schedule your annual preventive care visit! It's typically covered by insurance and helps identify potential health issues early. [x]  Lab & X-ray Appointments: Incomplete tests scheduled today, or call to schedule. X-rays: [facility] (M-F, 8:30am-noon or 1pm-5pm). [x]  Medical Information Release: Sign a release form at front desk to obtain relevant medical information we don't have.  Making the Most of Our Focused (20 minute) Appointments:  [x]   Clearly state your top concerns at the beginning of the visit to focus our discussion [x]   If you anticipate you will need more time, please inform the front desk during scheduling - we can book multiple appointments in the same week. [x]   If you have transportation problems- use our convenient video appointments or ask about transportation support. [x]   We can get down to business faster if you use MyChart to update information before the visit and submit non-urgent questions before your visit. Thank you for taking the time to provide details through MyChart.  Let our nurse know and she can import this information into your encounter documents.  Arrival and Wait Times: [x]   Arriving on time ensures that everyone receives prompt attention. [x]   Early morning (8a) and afternoon (1p) appointments tend to have  shortest wait times. [x]   Unfortunately, we cannot delay appointments for late arrivals or hold slots during phone  calls.  Getting Answers and Following Up  [x]   Simple Questions & Concerns: For quick questions or basic follow-up after your visit, reach [place] at [phone] or MyChart messaging. [x]   Complex Concerns: If your concern is more complex, scheduling an appointment might be best. Discuss this with the staff to find the most suitable option. [x]   Lab & Imaging Results: We'll contact you directly if results are abnormal or you don't use MyChart. Most normal results will be on MyChart within 2-3 business days, with a review message from Dr. Jon Billings. Haven't heard back in 2 weeks? Need results sooner? Contact us at [phone]. [x]   Referrals: Our referral coordinator will manage specialist referrals. The specialist's office should contact you within 2 weeks to schedule an appointment. Call us if you haven't heard from them after 2 weeks.  Staying Connected  [x]   MyChart: Activate your MyChart for the fastest way to access results and message [place]. See the last page of this paperwork for instructions on how to activate.  Bring to Your Next Appointment  [x]   Medications: Please bring all your medication bottles to your next appointment to ensure we have an accurate record of your prescriptions. [x]   Health Diaries: If you're monitoring any health conditions at home, keeping a diary of your readings can be very helpful for discussions at your next appointment.  Billing  [x]   X-ray & Lab Orders: These are billed by separate companies. Contact the invoicing company directly for questions or concerns. [x]   Visit Charges: Discuss any billing inquiries with our administrative services team.  Your Satisfaction Matters  [x]   Share Your Experience: We strive for your satisfaction! If you have any complaints, or preferably compliments, please let Dr. Jon Billings know directly or contact our Practice Administrators, Edwena Felty or [employer], by asking at the front desk.   Reviewing Your Records  [x]    Review this early draft of your clinical encounter notes below and the final encounter summary tomorrow on MyChart after its been completed.   HYPERTENSION, BENIGN  Hyperlipidemia LDL goal <130  Moderate persistent chronic asthma without complication Assessment & Plan: Patient reports well-controlled and will set up appointment with pulm   Laryngopharyngeal reflux Assessment & Plan: Patient reports occasional only, declined to address   Benign paroxysmal positional vertigo, unspecified laterality Assessment & Plan: Patient reports never had a workup  Last event end of April- bending over and standing up, gave sweats and vomiting We briefly discussed the potential benefits of vestibular rehab but he expressed a preference to not move forward with my offer(s) to facilitate the proposed intervention(s) at this time. We will revisit this option if needed at future visits and  he is always encouraged to contact me if the situation changes or he changes his mind.    Hypertension, unspecified type

## 2022-09-30 NOTE — Assessment & Plan Note (Signed)
Reviewed available data from patient and  BP Readings from Last 3 Encounters:  09/30/22 (!) 149/93  03/29/22 (!) 130/90  03/15/22 (!) 164/104   My individualized, goal average blood pressure for this patient, after considering the evidence for and against aggressive blood pressure goals as well as their past medical history and preferences, is 140/90 In my medical opinion, this problem is stable, inadequately controlled  Increased medication(s) prescribed after collaborative review. Following informed consent, we adjusted the medication regimen as per documented orders to now be:  Current hypertension medications:       Sig   losartan (COZAAR) 50 MG tablet (Taking) Take 1.5 tablets (75 mg total) by mouth daily.   sildenafil (VIAGRA) 50 MG tablet (Taking) Take 1 tablet (50 mg total) by mouth daily as needed for erectile dysfunction.     We explained the expected benefits and potential side effects of the new medications and encouraged the patient to report any concerns. Information for patient review: Please limit and avoid: salt, alcohol, NSAIDS, excess body weight, smoking, stress, sedentary lifestyles The risks of poor control over time are FUTURE stroke and heart attacks, but if you have a blood pressure over 180 and any red flag symptoms(headache/shortness of breath/confusion/chest discomfort) you should go to the ER.  You are encouraged to do home blood pressure monitoring, at least as many times per week as blood pressure medications you are on.  For example, bring a diary with 3 home blood pressure readings per week to each visit if you are on 3 blood pressure medications.   If you are on more than 3 medication(s) or have certain risk factors, we should do a resistant hypertension workup See AFTER VISIT SUMMARY for addition educational information provided Please let me know in advance when you need medication(s) refills, consistently taking your medication is very important!

## 2022-10-17 ENCOUNTER — Ambulatory Visit
Admission: RE | Admit: 2022-10-17 | Discharge: 2022-10-17 | Disposition: A | Discharge status: Home / Self Care | Payer: BC Managed Care – PPO | Source: Ambulatory Visit | Attending: Internal Medicine | Admitting: Internal Medicine

## 2022-10-17 DIAGNOSIS — I10 Essential (primary) hypertension: Secondary | ICD-10-CM

## 2022-10-17 DIAGNOSIS — E785 Hyperlipidemia, unspecified: Secondary | ICD-10-CM

## 2022-11-07 NOTE — Progress Notes (Unsigned)
Subjective:   Patient ID: Jose Crane, male    DOB: 1965/12/02    MRN: [id number]    Brief patient profile:  66  yowm never smoker with a history of childhood asthma & status asthmaticus requiring mechanical ventilation 4/97 at [facility].  He typically uses Symbicort, more on an as-needed basis rather than every day. He has only needed albuterol  rarely    History of Present Illness  09/13/2015  f/u ov/Tnya Ades re:  Chronic asthma on symbicort 160 2 each am/very rare saba  Chief Complaint  Patient presents with   Follow-up    Pt c/o "sensation" on the left side of his neck x 2 months.   area on neck is actually just under the mandible where he had recent dental surgery and denies ongoing dental concerns, pain, fever or chewing issues. No assoc ear ache or sinus problems  rec No change in recs    02/13/2022  f/u ov/Jazia Faraci re: asthma  maint on symbicort 160  avg once a day   Chief Complaint  Patient presents with   Follow-up    Annual visit. States he was at the ED on 10/15 for nausea and vomiting but is feeling much better. Stopped taking the Losartan due to recent weight loss.   Dyspnea:  tennis fine  Cough: none  Sleeping: no resp cc  SABA use: never any more 02: never  Rec No change rx     11/08/2022  f/u ov/Caylen Yardley re: asthma maint on symbicort 160 prn = once a day at most  / moving to [facility] but wants to return for yearly asthma eval at this office  Chief Complaint  Patient presents with   Follow-up    Symbicort works well, but needs to discuss Symbicort because insurance no longer covered, denies any issues at this time  Dyspnea:  tennis no problem (doubles)  Cough: none  Sleeping: no resp cc  SABA use: not using  02: not     No obvious day to day or daytime variability or assoc excess/ purulent sputum or mucus plugs or hemoptysis or cp or chest tightness, subjective wheeze or overt sinus or hb symptoms.   Sleeping  without nocturnal  or early am exacerbation  of  respiratory  c/o's or need for noct saba. Also denies any obvious fluctuation of symptoms with weather or environmental changes or other aggravating or alleviating factors except as outlined above   No unusual exposure hx or h/o childhood pna/ asthma or knowledge of premature birth.  Current Allergies, Complete Past Medical History, Past Surgical History, Family History, and Social History were reviewed in [employer] record.  ROS  The following are not active complaints unless bolded Hoarseness, sore throat, dysphagia, dental problems, itching, sneezing,  nasal congestion or discharge of excess mucus or purulent secretions, ear ache,   fever, chills, sweats, unintended wt loss or wt gain, classically pleuritic or exertional cp,  orthopnea pnd or arm/hand swelling  or leg swelling, presyncope, palpitations, abdominal pain, anorexia, nausea, vomiting, diarrhea  or change in bowel habits or change in bladder habits, change in stools or change in urine, dysuria, hematuria,  rash, arthralgias, visual complaints, headache, numbness, weakness or ataxia or problems with walking or coordination,  change in mood or  memory.        Current Meds  Medication Sig   albuterol (PROAIR HFA) 108 (90 Base) MCG/ACT inhaler INHALE 2 PUFFS INTO THE LUNGS EVERY 6 HOURS AS NEEDED FOR  WHEEZING OR SHORTNESS OF BREATH   aspirin 325 MG tablet Take 325 mg by mouth daily.   budesonide-formoterol (SYMBICORT) 160-4.5 MCG/ACT inhaler INHALE 2 PUFFS BY MOUTH FIRST THING IN THE AM, THEN 2 PPUFFS ABOUT 12 HOURS LATER   losartan (COZAAR) 50 MG tablet Take 2 tablets (100 mg total) by mouth daily.   meclizine (ANTIVERT) 25 MG tablet Take 1 tablet (25 mg total) by mouth 3 (three) times daily as needed for dizziness.   ondansetron (ZOFRAN) 4 MG tablet Take 1 tablet (4 mg total) by mouth every 8 (eight) hours as needed for nausea or vomiting.   sildenafil (VIAGRA) 50 MG tablet Take 1 tablet (50 mg total) by mouth  daily as needed for erectile dysfunction.                   Past Medical History:  HYPERLIPIDEMIA (ICD-272.4)  ASTHMA (ICD-493.90)  HEALTH MAINTENANCE .............................Marland Kitchen  Referred to [facility]  02/13/2022  - Pneumovax 02/2004 (second shot) and prevnar 13 11/24/2013  - Tdap 05/20/11 Ruptured achilles April 2010  DVT Left leg sp achilles surgery July 2010  - Repeat doppler 04/10/09: Extensive chronic L Leg DVT  - repeat doppler 10/11/09---resolution of DVT in CFV and SFV, residual clot in popliteal and peroneal veins > f/u [facility] (vascular surgery) rec asa only       Objective:   Physical Exam  Wts  11/08/2022    186  02/13/2022  185 11/29/2020      201 10/04/2019      205  06/30/2019      203  01/05/2018      201 Wt 209 March 14, 2008 > 216 May 15, 2009 >  > 222 November 07, 2009 > 221 05/20/2011 > 11/04/2012 196 > 05/28/2013  204 > 11/24/2013  206 > 09/13/2015  199 > 12/18/2015  208 >          Vital signs reviewed  11/08/2022  - Note at rest 02 sats  98% on RA   General appearance:    amb wm nad   HEENT : Oropharynx  clear    NECK :  without  apparent JVD/ palpable Nodes/TM   LUNGS: no acc muscle use,  Min barrel  contour chest wall with bilateral  distant exp wheeze and  without cough on insp or exp maneuvers and min  Hyperresonant  to  percussion bilaterally    CV:  RRR  no s3 or murmur or increase in P2, and no edema   ABD:  soft and nontender with pos end  insp Hoover's  in the supine position.  No bruits or organomegaly appreciated   MS:  Nl gait/ ext warm without deformities Or obvious joint restrictions  calf tenderness, cyanosis or clubbing     SKIN: warm and dry without lesions    NEURO:  alert, approp, nl sensorium with  no motor or cerebellar deficits apparent.             Assessment & Plan:

## 2022-11-08 ENCOUNTER — Ambulatory Visit (INDEPENDENT_AMBULATORY_CARE_PROVIDER_SITE_OTHER): Payer: BC Managed Care – PPO | Admitting: Internal Medicine

## 2022-11-08 ENCOUNTER — Encounter: Payer: Self-pay | Admitting: Internal Medicine

## 2022-11-08 VITALS — BP 128/70 | HR 60 | Temp 97.8°F | Ht 72.0 in | Wt 186.4 lb

## 2022-11-08 DIAGNOSIS — J4540 Moderate persistent asthma, uncomplicated: Secondary | ICD-10-CM | POA: Diagnosis not present

## 2022-11-08 MED ORDER — MOMETASONE FURO-FORMOTEROL FUM 200-5 MCG/ACT IN AERO: INHALATION_SPRAY | RESPIRATORY_TRACT | 11 refills | Status: DC

## 2022-11-08 NOTE — Patient Instructions (Addendum)
Change dulera 200 (same as symbiort 160)  is up to 2 puff every 12 hours until completely better for at least a week before you try tapering it like you did symbicort   Keep your rescue inhaler with you at all times and if needing rescue at all, need full doses of dulera 200 as above  Please schedule a follow up visit in 12  months but call sooner if needed

## 2022-11-09 ENCOUNTER — Encounter: Payer: Self-pay | Admitting: Internal Medicine

## 2022-11-09 NOTE — Assessment & Plan Note (Signed)
Onset in childhood/ req mech vent 1997  09/03/2010 > decreased symbicort 80/4.54mcg  -  11/04/2012  > increased symbicort to 160 bid as not consistent with use  - 11/14/2017  After extensive coaching inhaler device  effectiveness =    90% > rec continue symb 160 2bid  - Spirometry 01/05/2018  FEV1 4.5 (111%)  Ratio 90 p no rx prior  - Allergy profile 01/05/18  >  Eos 0.5 /  IgE  598 RAST pos everything but mold  I continue to be concerned that he is undertreating his asthma but despite prior mech ventilation it's been a challenge re-enforcing more consistent use of ICS so I've agreed to meet half way with use of symbicort 160 or dulera 200 used prn the way it is in [place] and so far he's done well.   F/u can be yearly for refills unless he establishes with pulmonary or allergy in [facility].          Each maintenance medication was reviewed in detail including emphasizing most importantly the difference between maintenance and prns and under what circumstances the prns are to be triggered using an action plan format where appropriate.  Total time for H and P, chart review, counseling, reviewing hfa  device(s) and generating customized AVS unique to this office visit / same day charting = 33 min final summary f/u ov

## 2022-12-25 ENCOUNTER — Other Ambulatory Visit: Payer: Self-pay | Admitting: Internal Medicine

## 2022-12-25 DIAGNOSIS — I700 Atherosclerosis of aorta: Secondary | ICD-10-CM

## 2022-12-25 DIAGNOSIS — I2510 Atherosclerotic heart disease of native coronary artery without angina pectoris: Secondary | ICD-10-CM

## 2022-12-25 MED ORDER — ATORVASTATIN CALCIUM 40 MG PO TABS: 40.0000 mg | ORAL_TABLET | Freq: Every day | ORAL | 3 refills | Status: AC

## 2022-12-25 NOTE — Progress Notes (Signed)
CT Coronary Calcium Score Results: - Total Agatston score: 847 (98th percentile) - Indicates high risk for coronary artery disease  Analysis and Plan: 1. Start statin therapy: atorvastatin 40 mg daily and - I'm ordering 2. Continue Losartan 100mg  daily for hypertension 3. Emphasize lifestyle modifications: heart-healthy diet and exercise 4. Order cardiology referral - please order   Follow-up: - Schedule cardiology appointment within 2-4 weeks - Patient to return to clinic in 3 months  Phone Follow-up Instructions: 1. Confirm patient understands the significance of results 2. Verify they've received the new statin prescription 3. Assist in scheduling cardiology appointment if needed 4. Address any immediate concerns or questions  Document any additional information or concerns raised during the call.

## 2023-02-12 ENCOUNTER — Telehealth: Payer: Self-pay | Admitting: Internal Medicine

## 2023-02-12 NOTE — Telephone Encounter (Signed)
Patient states needs new RX for [facility]. Pharmacy is [facility] [address] [facility]. Patient phone number is [phone].

## 2023-02-14 ENCOUNTER — Telehealth: Payer: Self-pay | Admitting: Internal Medicine

## 2023-02-14 MED ORDER — MOMETASONE FURO-FORMOTEROL FUM 200-5 MCG/ACT IN AERO: INHALATION_SPRAY | RESPIRATORY_TRACT | 11 refills | Status: DC

## 2023-02-14 NOTE — Telephone Encounter (Signed)
Attempted to call pt but unable to reach. Left pt a detailed message letting him know that we were sending Rx for his Elwin Sleight to preferred pharmacy stated. [facility] [place] [place].

## 2023-02-14 NOTE — Telephone Encounter (Signed)
Dr. Sherene Sires, please advise on different inhaler for pt other than Dulera.

## 2023-02-14 NOTE — Telephone Encounter (Signed)
Can try generic symbicort  160  Take 2 puffs first thing in am and then another 2 puffs about 12 hours later.

## 2023-02-14 NOTE — Telephone Encounter (Signed)
Patient states Jose Crane is too expensive. Would like generic or something else called into pharmacy. Pharmacy is [facility]. Patient phone number is [phone].

## 2023-02-15 ENCOUNTER — Other Ambulatory Visit: Payer: Self-pay | Admitting: Internal Medicine

## 2023-02-15 MED ORDER — BUDESONIDE-FORMOTEROL FUMARATE 160-4.5 MCG/ACT IN AERO: 2.0000 | INHALATION_SPRAY | Freq: Two times a day (BID) | RESPIRATORY_TRACT | 6 refills | Status: DC

## 2023-02-15 NOTE — Telephone Encounter (Signed)
Rx sent to pharmacy of Symbicort for pt.

## 2023-02-16 ENCOUNTER — Telehealth: Payer: Self-pay | Admitting: Internal Medicine

## 2023-02-16 NOTE — Telephone Encounter (Signed)
Received a message from patient's pharmacy is Symbicort is not covered by his insurance. Insurance is wanting to switch him to Wixela .   Dr. Sherene Sires, are you ok with this switch or would you like for the PA team to attempt a PA for the Symbicort? Per his chart, he has already tried the Symbicort and was using the Symbicort as recently as last year, 2023.

## 2023-02-17 ENCOUNTER — Other Ambulatory Visit: Payer: Self-pay | Admitting: Internal Medicine

## 2023-02-17 MED ORDER — FLUTICASONE-SALMETEROL 250-50 MCG/ACT IN AEPB: 1.0000 | INHALATION_SPRAY | Freq: Two times a day (BID) | RESPIRATORY_TRACT | 5 refills | Status: DC

## 2023-02-17 NOTE — Telephone Encounter (Signed)
Ok to try wixella 250 but if fails to do as well on it then  can do a PA that will probably go thru.  Other option is get it from [place] and pay cash on his own

## 2023-02-17 NOTE — Telephone Encounter (Signed)
Wixella 250 sent Pt aware of change

## 2023-03-07 ENCOUNTER — Encounter: Payer: Self-pay | Admitting: Gastroenterology

## 2023-04-01 ENCOUNTER — Encounter: Payer: BC Managed Care – PPO | Admitting: Internal Medicine

## 2023-06-03 ENCOUNTER — Telehealth: Payer: Self-pay | Admitting: Internal Medicine

## 2023-06-03 MED ORDER — ALBUTEROL SULFATE HFA 108 (90 BASE) MCG/ACT IN AERS: INHALATION_SPRAY | RESPIRATORY_TRACT | 1 refills | Status: DC

## 2023-06-03 NOTE — Telephone Encounter (Signed)
Albuterol Rx sent to preferred pharmacy.  Patient is aware and voiced his understanding.  Nothing further needed.

## 2023-06-03 NOTE — Telephone Encounter (Signed)
Patient states needs refill for Albuterol inhaler. [facility] [address] [facility]. Phone number is [phone]. Patient is out of medication.  Patient phone number is [phone].

## 2023-08-26 ENCOUNTER — Other Ambulatory Visit: Payer: Self-pay | Admitting: Internal Medicine

## 2023-10-07 ENCOUNTER — Other Ambulatory Visit: Payer: Self-pay | Admitting: Internal Medicine

## 2023-10-07 DIAGNOSIS — I10 Essential (primary) hypertension: Secondary | ICD-10-CM

## 2023-10-30 ENCOUNTER — Telehealth: Payer: Self-pay | Admitting: *Deleted

## 2023-10-30 NOTE — Telephone Encounter (Signed)
 Sched appt. NFN

## 2023-10-30 NOTE — Telephone Encounter (Signed)
 Copied from CRM [id number]. Topic: Appointments - Scheduling Inquiry for Clinic >> Oct 29, 2023  2:42 PM Whitney O wrote: Reason for CRM: patient is calling to get a appointment with dr wert but no appointments are pulling up till September 2 and patient says he doesn't live in [place] anymore but he will be coming back to [place] and he would like to be fitted in if possible on the afternoon on 7/23 or anytime on the 24th or morning time on the 25th . Please reach out to patient to see if he is able to be fitted into the schedule  [phone]  It is not possible for pt to see Dr. Darlean here in [place] on the dates mentioned above, bc he is not in the office here.  He can be scheduled with APP or MW next opening. Routing to [employer] for scheduling.

## 2023-11-10 ENCOUNTER — Other Ambulatory Visit: Payer: Self-pay | Admitting: Internal Medicine

## 2023-11-10 DIAGNOSIS — I10 Essential (primary) hypertension: Secondary | ICD-10-CM

## 2023-11-19 ENCOUNTER — Encounter: Payer: Self-pay | Admitting: Internal Medicine

## 2023-11-19 ENCOUNTER — Ambulatory Visit: Admitting: Internal Medicine

## 2023-11-19 VITALS — BP 126/70 | HR 68 | Temp 98.0°F | Ht 72.0 in | Wt 186.5 lb

## 2023-11-19 DIAGNOSIS — Z91018 Allergy to other foods: Secondary | ICD-10-CM

## 2023-11-19 DIAGNOSIS — J4540 Moderate persistent asthma, uncomplicated: Secondary | ICD-10-CM

## 2023-11-19 DIAGNOSIS — H8110 Benign paroxysmal vertigo, unspecified ear: Secondary | ICD-10-CM

## 2023-11-19 DIAGNOSIS — I10 Essential (primary) hypertension: Secondary | ICD-10-CM

## 2023-11-19 DIAGNOSIS — R5383 Other fatigue: Secondary | ICD-10-CM

## 2023-11-19 DIAGNOSIS — I2510 Atherosclerotic heart disease of native coronary artery without angina pectoris: Secondary | ICD-10-CM | POA: Diagnosis not present

## 2023-11-19 DIAGNOSIS — N529 Male erectile dysfunction, unspecified: Secondary | ICD-10-CM

## 2023-11-19 LAB — LIPID PANEL W/REFLEX DIRECT LDL
Cholesterol: 253 mg/dL — ABNORMAL HIGH (ref <200)
HDL: 58 mg/dL (ref >=40)
LDL Cholesterol (Calc): 165 mg/dL — ABNORMAL HIGH
Non-HDL Cholesterol (Calc): 195 mg/dL — ABNORMAL HIGH (ref <130)
Total CHOL/HDL Ratio: 4.4 (calc) (ref <5.0)
Triglycerides: 151 mg/dL — ABNORMAL HIGH (ref <150)

## 2023-11-19 MED ORDER — MECLIZINE HCL 25 MG PO TABS: 25.0000 mg | ORAL_TABLET | Freq: Three times a day (TID) | ORAL | 1 refills | Status: AC | PRN

## 2023-11-19 MED ORDER — LOSARTAN POTASSIUM 50 MG PO TABS: 100.0000 mg | ORAL_TABLET | Freq: Every day | ORAL | 2 refills | Status: AC

## 2023-11-19 MED ORDER — ASPIRIN 325 MG PO TABS: 325.0000 mg | ORAL_TABLET | Freq: Every day | ORAL | 4 refills | Status: AC

## 2023-11-19 MED ORDER — ONDANSETRON HCL 4 MG PO TABS
4.0000 mg | ORAL_TABLET | Freq: Three times a day (TID) | ORAL | 0 refills | Status: AC | PRN
Start: 2023-11-19 — End: ?

## 2023-11-19 MED ORDER — SILDENAFIL CITRATE 50 MG PO TABS: 50.0000 mg | ORAL_TABLET | Freq: Every day | ORAL | 5 refills | Status: AC | PRN

## 2023-11-19 MED ORDER — EPINEPHRINE 0.3 MG/0.3ML IJ SOAJ: 0.3000 mg | INTRAMUSCULAR | 11 refills | Status: AC | PRN

## 2023-11-19 MED ORDER — WIXELA INHUB 250-50 MCG/ACT IN AEPB: 1.0000 | INHALATION_SPRAY | Freq: Two times a day (BID) | RESPIRATORY_TRACT | 5 refills | Status: DC

## 2023-11-19 MED ORDER — ALBUTEROL SULFATE HFA 108 (90 BASE) MCG/ACT IN AERS: 2.0000 | INHALATION_SPRAY | Freq: Four times a day (QID) | RESPIRATORY_TRACT | 1 refills | Status: AC | PRN

## 2023-11-19 NOTE — Assessment & Plan Note (Signed)
 Refills viagra 

## 2023-11-19 NOTE — Assessment & Plan Note (Signed)
 Blood pressure is well-controlled with Losartan  100 mg daily, with today's reading at 126/70 mmHg. Continue Losartan  100 mg daily.

## 2023-11-19 NOTE — Assessment & Plan Note (Signed)
 Refills meclizine  but rarely needs.

## 2023-11-19 NOTE — Assessment & Plan Note (Signed)
 He has high cholesterol with known atherosclerosis and is not currently taking atorvastatin . The importance of managing LDL levels was discussed. A non-fasting cholesterol panel is planned as he is not fasting today. Consider restarting atorvastatin  based on cholesterol results.  He agreed to have it just called back in if cholesterol is not good on today bloodwork.

## 2023-11-19 NOTE — Patient Instructions (Signed)
 It was a pleasure seeing you today! Your health and satisfaction are our top priorities.  Jose Cone, MD  Your Providers PCP: [name], MD,  [phone]) Referring Provider: [name], MD,  [phone])   VISIT SUMMARY:  Today, you were seen for increased fatigue, which you believe is due to extensive travel and changes in your sleep schedule. We also reviewed your history of asthma, anaphylaxis, hypertension, and high cholesterol. Your asthma is well-managed, and your blood pressure is under control. We discussed the importance of managing your cholesterol levels and the need for a physical exam in the fall.  YOUR PLAN:  -FATIGUE: Your mild fatigue is likely due to increased travel and changes in your sleep patterns. Although there is a possibility of sleep apnea, you report good sleep quality. Consider using an Apple Watch to screen for sleep apnea.  -ASTHMA: Asthma is a condition where your airways narrow and swell, making it difficult to breathe. Your asthma is well-managed with Wixela as needed, and you have a new inhaler at home. Continue using Wixela as needed and follow up with your pulmonologist.  -ANAPHYLAXIS: Anaphylaxis is a severe allergic reaction that can be life-threatening. 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 Remember to call 911 after using the EpiPen . A new EpiPen  prescription has been provided.  -HYPERTENSION: Hypertension is high blood pressure. Your blood pressure is well-controlled with Losartan  100 mg daily. Continue taking Losartan  100 mg daily.  -HYPERLIPIDEMIA: Hyperlipidemia is high cholesterol. We discussed the importance of managing your LDL levels. A non-fasting cholesterol panel is planned, and we may consider restarting atorvastatin  based on the results.  -GENERAL HEALTH MAINTENANCE: You acknowledge the need for a physical exam and plan to schedule one in the fall. Routine labs and normal  studies will be reviewed during your physical exam. Continue with your current lifestyle and dietary habits, and consider incorporating more avocados and fish into your diet.  INSTRUCTIONS:  Schedule a physical exam in the fall. Follow up with your pulmonologist for asthma management. Complete the non-fasting cholesterol panel as planned.  NEXT STEPS: [x]  Early Intervention: Schedule sooner appointment, call our on-call services, or go to emergency room if there is any significant Increase in pain or discomfort New or worsening symptoms Sudden or severe changes in your health [x]  Flexible Follow-Up: We recommend a No follow-ups on file. for optimal routine care. This allows for progress monitoring and treatment adjustments. [x]  Preventive Care: Schedule your annual preventive care visit! It's typically covered by insurance and helps identify potential health issues early. [x]  Lab & X-ray Appointments: Incomplete tests scheduled today, or call to schedule. X-rays: [facility] (M-F, 8:30am-noon or 1pm-5pm). [x]  Medical Information Release: Sign a release form at front desk to obtain relevant medical information we don't have.  MAKING THE MOST OF OUR FOCUSED 20 MINUTE APPOINTMENTS: [x]   Clearly state your top concerns at the beginning of the visit to focus our discussion [x]   If you anticipate you will need more time, please inform the front desk during scheduling - we can book multiple appointments in the same week. [x]   If you have transportation problems- use our convenient video appointments or ask about transportation support. [x]   We can get down to business faster if you use MyChart to update information before the visit and submit non-urgent questions before your visit. Thank you for taking the time to provide details through MyChart.  Let our nurse know  and she can import this information into your encounter documents.  Arrival and Wait Times: [x]   Arriving on time  ensures that everyone receives prompt attention. [x]   Early morning (8a) and afternoon (1p) appointments tend to have shortest wait times. [x]   Unfortunately, we cannot delay appointments for late arrivals or hold slots during phone calls.  Getting Answers and Following Up [x]   Simple Questions & Concerns: For quick questions or basic follow-up after your visit, reach us  at [phone] or MyChart messaging. [x]   Complex Concerns: If your concern is more complex, scheduling an appointment might be best. Discuss this with the staff to find the most suitable option. [x]   Lab & Imaging Results: We'll contact you directly if results are abnormal or you don't use MyChart. Most normal results will be on MyChart within 2-3 business days, with a review message from Dr. Jesus. Haven't heard back in 2 weeks? Need results sooner? Contact us  at [phone]. [x]   Referrals: Our referral coordinator will manage specialist referrals. The specialist's office should contact you within 2 weeks to schedule an appointment. Call us  if you haven't heard from them after 2 weeks.  Staying Connected [x]   MyChart: Activate your MyChart for the fastest way to access results and message us . See the last page of this paperwork for instructions on how to activate.  Bring to Your Next Appointment [x]   Medications: Please bring all your medication bottles to your next appointment to ensure we have an accurate record of your prescriptions. [x]   Health Diaries: If you're monitoring any health conditions at home, keeping a diary of your readings can be very helpful for discussions at your next appointment.  Billing [x]   X-ray & Lab Orders: These are billed by separate companies. Contact the invoicing company directly for questions or concerns. [x]   Visit Charges: Discuss any billing inquiries with our administrative services team.  Your Satisfaction Matters [x]   Share Your Experience: We strive for your satisfaction! If  you have any complaints, or preferably compliments, please let Dr. Jesus know directly or contact our Practice Administrators, Manuelita Rubin or [employer], by asking at the front desk.   Reviewing Your Records [x]   Review this early draft of your clinical encounter notes below and the final encounter summary tomorrow on MyChart after its been completed.  All orders placed so far are visible here: Hypertension, unspecified type -     Losartan  Potassium; Take 2 tablets (100 mg total) by mouth daily.  Dispense: 180 tablet; Refill: 2 -     Comprehensive metabolic panel with GFR -     CBC with Differential/Platelet  Atherosclerosis of native coronary artery of native heart without angina pectoris -     Aspirin ; Take 1 tablet (325 mg total) by mouth daily.  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  Dispense: 1 each; Refill: 11  Benign paroxysmal positional vertigo, unspecified laterality -     Meclizine  HCl; Take 1 tablet (25 mg total) by mouth 3 (three) times daily as needed for dizziness.  Dispense: 30 tablet; Refill: 1 -     Ondansetron  HCl; Take 1 tablet (4 mg total) by mouth every 8 (eight) hours as needed for nausea or vomiting.  Dispense: 20 tablet; Refill: 0  Erectile dysfunction, unspecified erectile dysfunction type -     Sildenafil  Citrate;  Take 1 tablet (50 mg total) by mouth daily as needed for erectile dysfunction.  Dispense: 10 tablet; Refill: 5  Moderate persistent chronic asthma without complication -     Albuterol  Sulfate HFA; Inhale 2 puffs into the lungs every 6 (six) hours as needed for wheezing or shortness of breath.  Dispense: 6.7 each; Refill: 1 -     Wixela Inhub ; Inhale 1 puff into the lungs in the morning and at bedtime.  Dispense: 60 each; Refill: 5  Other fatigue -     Comprehensive metabolic panel with GFR -     CBC with  Differential/Platelet -     Lipid Panel w/reflex Direct LDL  History of food anaphylaxis

## 2023-11-19 NOTE — Progress Notes (Signed)
 ==============================  [facility]: [phone]   -- Medical Office Visit --  Patient: Jose Crane      Age: 58 y.o.       Sex:  male  Date:   11/19/2023 Today's Healthcare Provider: Bernardino KANDICE Cone, MD  ==============================   Chief Complaint: Medication Problem (Needs refills of bp medication today no concerns)  Discussed the use of AI scribe software for clinical note transcription with the patient, who gave verbal consent to proceed. History of Present Illness  58 year old male who presents with increased fatigue.  He has been experiencing increased fatigue, which he attributes to extensive travel and changes in his sleep schedule. He spent 14 nights in a hotel in June due to family obligations and has been waking up early due to his wife's alarm. He goes to bed around 9:00 to 9:30 PM and wakes up at 4:30 to 5:00 AM, often feeling the need for a nap in the afternoon. No significant concern but notes the change in his energy levels.  He has a history of asthma and currently uses Wixela as needed, having been switched from Symbicort  due to insurance issues. His asthma is not as severe as it used to be and he does not use the inhaler daily. He also has an albuterol  inhaler available. No recent major problems with asthma.  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 He carries an EpiPen  for emergencies and has not had any major problems recently.  He mentions a history of high cholesterol but does not recall taking atorvastatin  recently. He is not currently on cholesterol medication and is unsure of his current cholesterol levels.  He has a history of hypertension, which is currently well-controlled with losartan . He engages in physical activity, specifically playing tennis. No recent blood clots or heart issues. He wants to exercise more and  improve his diet, which currently lacks foods like avocados and fish, although he does consume broccoli and asparagus.  No recent dizziness, heart issues, or blood clots. No recent major problems with his asthma or anaphylaxis. He notes increased fatigue but attributes it to travel and sleep patterns.  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 Past Surgical History:   has a past surgical history that includes Achilles tendon repair (Bilateral). Social History:   reports that he has never smoked. He has never used smokeless tobacco. He reports current alcohol use of about 4.0 - 5.0 standard drinks of alcohol per week. He reports current drug use. Drug: Marijuana. Family History:  family history is not on file. He was adopted. Allergies:  has no known allergies.   Medication Reconciliation: Current Outpatient Medications on File Prior to Visit  Medication Sig   atorvastatin  (LIPITOR) 40 MG tablet Take 1 tablet (40 mg total) by mouth daily. (Patient not taking: Reported  on 11/19/2023)   No current facility-administered medications on file prior to visit.   Medications Discontinued During This Encounter  Medication Reason   aspirin  325 MG tablet  Reorder   sildenafil  (VIAGRA ) 50 MG tablet Reorder   EPINEPHrine  (EPIPEN  2-PAK) 0.3 mg/0.3 mL IJ SOAJ injection Reorder   meclizine  (ANTIVERT ) 25 MG tablet Reorder   ondansetron  (ZOFRAN ) 4 MG tablet Reorder   fluticasone -salmeterol (WIXELA INHUB ) 250-50 MCG/ACT AEPB Reorder   albuterol  (VENTOLIN  HFA) 108 (90 Base) MCG/ACT inhaler Reorder   losartan  (COZAAR ) 50 MG tablet Reorder    Physical Exam:    11/19/2023    3:33 PM 11/08/2022    9:00 AM 09/30/2022    9:01 AM  Vitals with BMI  Height 6' 0 6' 0   Weight 186 lbs 8 oz 186 lbs 6 oz   BMI 25.29 25.27   Systolic 126 128 859  Diastolic 70 70 90  Pulse 68 60   Vital signs reviewed.  Nursing notes reviewed. Weight trend reviewed. Physical Exam General Appearance:  No acute distress appreciable.   Well-groomed, healthy-appearing male.  Well proportioned with no abnormal fat distribution.  Good muscle tone. Pulmonary:  Normal work of breathing at rest, no respiratory distress apparent. SpO2: 98 %  Musculoskeletal: All extremities are intact.  Neurological:  Awake, alert, oriented, and engaged.  No obvious focal neurological deficits or cognitive impairments.  Sensorium seems unclouded.   Speech is clear and coherent with logical content. Psychiatric:  Appropriate mood, pleasant and cooperative demeanor, thoughtful and engaged during the exam  Results:    11/19/2023    3:40 PM 09/30/2022    8:19 AM 03/29/2022    8:14 AM  PHQ 2/9 Scores  PHQ - 2 Score 0 0 0  PHQ- 9 Score 0    = Office Visit on 03/29/2022  Component Date Value Ref Range Status   Cholesterol 03/29/2022 248 (H)  0 - 200 mg/dL Final   Triglycerides 87/98/7976 83.0  0.0 - 149.0 mg/dL Final   HDL 87/98/7976 64.10  >39.00 mg/dL Final   VLDL 87/98/7976 16.6  0.0 - 40.0 mg/dL Final   LDL Cholesterol 03/29/2022 167 (H)  0 - 99 mg/dL Final   Total CHOL/HDL Ratio 03/29/2022 4   Final   NonHDL 03/29/2022 183.59   Final   Sodium 03/29/2022 137  135 - 145 mEq/L Final   Potassium  03/29/2022 4.4  3.5 - 5.1 mEq/L Final   Chloride 03/29/2022 101  96 - 112 mEq/L Final   CO2 03/29/2022 29  19 - 32 mEq/L Final   Glucose, Bld 03/29/2022 97  70 - 99 mg/dL Final   BUN 87/98/7976 20  6 - 23 mg/dL Final   Creatinine, Ser 03/29/2022 1.22  0.40 - 1.50 mg/dL Final   Total Bilirubin 03/29/2022 0.9  0.2 - 1.2 mg/dL Final   Alkaline Phosphatase 03/29/2022 48  39 - 117 U/L Final   AST 03/29/2022 17  0 - 37 U/L Final   ALT 03/29/2022 16  0 - 53 U/L Final   Total Protein 03/29/2022 7.2  6.0 - 8.3 g/dL Final   Albumin 87/98/7976 4.6  3.5 - 5.2 g/dL Final   GFR 87/98/7976 67.30  >60.00 mL/min Final   Calcium  03/29/2022 10.0  8.4 - 10.5 mg/dL Final   WBC 87/98/7976 6.4  4.0 - 10.5 K/uL Final   RBC 03/29/2022 5.58  4.22 - 5.81 Mil/uL Final   Platelets 03/29/2022 241.0  150.0 - 400.0 K/uL Final   Hemoglobin  03/29/2022 16.1  13.0 - 17.0 g/dL Final   HCT 87/98/7976 48.1  39.0 - 52.0 % Final   MCV 03/29/2022 86.2  78.0 - 100.0 fl Final   MCHC 03/29/2022 33.4  30.0 - 36.0 g/dL Final   RDW 87/98/7976 14.5  11.5 - 15.5 % Final   HIV 1&2 Ab, 4th Generation 03/29/2022 NON-REACTIVE  NON-REACTIVE Final   Hepatitis C Ab 03/29/2022 NON-REACTIVE  NON-REACTIVE Final  Admission on 02/10/2022, Discharged on 02/10/2022  Component Date Value Ref Range Status   Lipase 02/10/2022 <10 (L)  11 - 51 U/L Final   Sodium 02/10/2022 131 (L)  135 - 145 mmol/L Final   Potassium 02/10/2022 3.6  3.5 - 5.1 mmol/L Final   Chloride 02/10/2022 96 (L)  98 - 111 mmol/L Final   CO2 02/10/2022 24  22 - 32 mmol/L Final   Glucose, Bld 02/10/2022 117 (H)  70 - 99 mg/dL Final   BUN 89/84/7976 17  6 - 20 mg/dL Final   Creatinine, Ser 02/10/2022 1.03  0.61 - 1.24 mg/dL Final   Calcium  02/10/2022 10.0  8.9 - 10.3 mg/dL Final   Total Protein 89/84/7976 8.1  6.5 - 8.1 g/dL Final   Albumin 89/84/7976 4.2  3.5 - 5.0 g/dL Final   AST 89/84/7976 26  15 - 41 U/L Final   ALT 02/10/2022 23  0 - 44 U/L Final   Alkaline Phosphatase  02/10/2022 65  38 - 126 U/L Final   Total Bilirubin 02/10/2022 0.8  0.3 - 1.2 mg/dL Final   GFR, Estimated 02/10/2022 >60  >60 mL/min Final   Anion gap 02/10/2022 11  5 - 15 Final   WBC 02/10/2022 7.8  4.0 - 10.5 K/uL Final   RBC 02/10/2022 4.93  4.22 - 5.81 MIL/uL Final   Hemoglobin 02/10/2022 14.4  13.0 - 17.0 g/dL Final   HCT 89/84/7976 41.6  39.0 - 52.0 % Final   MCV 02/10/2022 84.4  80.0 - 100.0 fL Final   MCH 02/10/2022 29.2  26.0 - 34.0 pg Final   MCHC 02/10/2022 34.6  30.0 - 36.0 g/dL Final   RDW 89/84/7976 12.2  11.5 - 15.5 % Final   Platelets 02/10/2022 254  150 - 400 K/uL Final   nRBC 02/10/2022 0.0  0.0 - 0.2 % Final   Color, Urine 02/10/2022 YELLOW  YELLOW Final   APPearance 02/10/2022 CLEAR  CLEAR Final   Specific Gravity, Urine 02/10/2022 1.016  1.005 - 1.030 Final   pH 02/10/2022 5.5  5.0 - 8.0 Final   Glucose, UA 02/10/2022 NEGATIVE  NEGATIVE mg/dL Final   Hgb urine dipstick 02/10/2022 MODERATE (A)  NEGATIVE Final   Bilirubin Urine 02/10/2022 NEGATIVE  NEGATIVE Final   Ketones, ur 02/10/2022 TRACE (A)  NEGATIVE mg/dL Final   Protein, ur 89/84/7976 TRACE (A)  NEGATIVE mg/dL Final   Nitrite 89/84/7976 NEGATIVE  NEGATIVE Final   Leukocytes,Ua 02/10/2022 NEGATIVE  NEGATIVE Final   RBC / HPF 02/10/2022 0-5  0 - 5 RBC/hpf Final   WBC, UA 02/10/2022 0-5  0 - 5 WBC/hpf Final   Troponin I (High Sensitivity) 02/10/2022 5  <18 ng/L Final   Troponin I (High Sensitivity) 02/10/2022 4  <18 ng/L Final  No image results found. No results found.       ASSESSMENT & PLAN   Assessment & Plan Hypertension, unspecified type Blood pressure is well-controlled with Losartan  100 mg daily, with today's reading at 126/70 mmHg. Continue Losartan  100 mg daily. Atherosclerosis of native coronary artery  of native heart without angina pectoris He has high cholesterol with known atherosclerosis and is not currently taking atorvastatin . The importance of managing LDL levels was discussed. A  non-fasting cholesterol panel is planned as he is not fasting today. Consider restarting atorvastatin  based on cholesterol results.  He agreed to have it just called back in if cholesterol is not good on today bloodwork. 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 Emphasized the importance of calling 911 after EpiPen  use. Prescribe one EpiPen  for emergency use. Benign paroxysmal positional vertigo, unspecified laterality Refills meclizine  but rarely needs. Erectile dysfunction, unspecified erectile dysfunction type Refills viagra  Moderate persistent chronic asthma without complication Asthma is well-managed with Wixela as needed, with no recent exacerbations. He has a new Wixela inhaler at home and a pulmonologist appointment scheduled for further management. Insurance-related issues with medication coverage and his potential impact on asthma management were discussed. Continue Wixela as needed and follow up with the pulmonologist. Other fatigue Mild fatigue is likely due to increased travel and altered sleep patterns, with no significant medical concerns. Although daytime drowsiness suggests possible sleep apnea, he reports good sleep quality. Consider using an Apple Watch to screen for sleep apnea. 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 Emphasized the importance of calling 911 after EpiPen  use. Prescribe one EpiPen  for emergency use.  General Health Maintenance   He acknowledges the need for a physical exam and plans to schedule one in the fall. Schedule a physical exam in the fall.  ORDER ASSOCIATIONS  #   DIAGNOSIS / CONDITION ICD-10 ENCOUNTER ORDER     ICD-10-CM   1. Hypertension, unspecified type  I10 losartan  (COZAAR ) 50 MG tablet    Comprehensive metabolic panel with GFR    CBC with  Differential/Platelet    2. Atherosclerosis of native coronary artery of native heart without angina pectoris  I25.10 aspirin  325 MG tablet    Lipid Panel w/reflex Direct LDL    3. 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 Benign paroxysmal positional vertigo, unspecified laterality  H81.10 meclizine  (ANTIVERT ) 25 MG tablet    ondansetron  (ZOFRAN ) 4 MG tablet    5. Erectile dysfunction, unspecified erectile dysfunction type  N52.9 sildenafil  (VIAGRA ) 50 MG tablet    6. Moderate persistent chronic asthma without complication  J45.40 albuterol  (VENTOLIN  HFA) 108 (90 Base) MCG/ACT inhaler    fluticasone -salmeterol (WIXELA INHUB ) 250-50 MCG/ACT AEPB    7. Other fatigue  R53.83 Comprehensive metabolic panel with GFR    CBC with Differential/Platelet    Lipid Panel w/reflex Direct LDL    8. History of food anaphylaxis  Z91.018      Meds ordered this encounter  Medications   albuterol  (VENTOLIN  HFA) 108 (90 Base) MCG/ACT inhaler    Sig: Inhale 2 puffs into the lungs every 6 (six) hours as needed for wheezing or shortness of breath.    Dispense:  6.7 each    Refill:  1   aspirin  325 MG tablet    Sig: Take 1 tablet (325 mg total) by mouth daily.    Dispense:  90 tablet    Refill:  4   EPINEPHrine  (EPIPEN  2-PAK) 0.3 mg/0.3 mL IJ SOAJ injection    Sig: Inject 0.3 mg into the muscle as needed for anaphylaxis.  Dispense:  1 each    Refill:  11   fluticasone -salmeterol (WIXELA INHUB ) 250-50 MCG/ACT AEPB    Sig: Inhale 1 puff into the lungs in the morning and at bedtime.    Dispense:  60 each    Refill:  5   losartan  (COZAAR ) 50 MG tablet    Sig: Take 2 tablets (100 mg total) by mouth daily.    Dispense:  180 tablet    Refill:  2   meclizine  (ANTIVERT ) 25 MG tablet    Sig: Take 1 tablet (25 mg total) by mouth 3 (three) times daily as needed for dizziness.    Dispense:  30 tablet    Refill:  1   ondansetron  (ZOFRAN ) 4 MG tablet     Sig: Take 1 tablet (4 mg total) by mouth every 8 (eight) hours as needed for nausea or vomiting.    Dispense:  20 tablet    Refill:  0   sildenafil  (VIAGRA ) 50 MG tablet    Sig: Take 1 tablet (50 mg total) by mouth daily as needed for erectile dysfunction.    Dispense:  10 tablet    Refill:  5    Orders Placed This Encounter  Procedures   Comprehensive metabolic panel with GFR    Has the patient fasted?:   No    Release to patient:   Immediate [1]   CBC with Differential/Platelet    Release to patient:   Immediate [1]   Lipid Panel w/reflex Direct LDL        This document was synthesized by artificial intelligence ([employer]) using HIPAA-compliant recording of the clinical interaction;   We discussed the use of AI scribe software for clinical note transcription with the patient, who gave verbal consent to proceed. additional Info: This encounter employed state-of-the-art, real-time, collaborative documentation. The patient actively reviewed and assisted in updating their electronic medical record on a shared screen, ensuring transparency and facilitating joint problem-solving for the problem list, overview, and plan. This approach promotes accurate, informed care. The treatment plan was discussed and reviewed in detail, including medication safety, potential side effects, and all patient questions. We confirmed understanding and comfort with the plan. Follow-up instructions were established, including contacting the office for any concerns, returning if symptoms worsen, persist, or new symptoms develop, and precautions for potential emergency department visits.

## 2023-11-19 NOTE — Assessment & Plan Note (Signed)
 Asthma is well-managed with Wixela as needed, with no recent exacerbations. He has a new Wixela inhaler at home and a pulmonologist appointment scheduled for further management. Insurance-related issues with medication coverage and his potential impact on asthma management were discussed. Continue Wixela as needed and follow up with the pulmonologist.

## 2023-11-19 NOTE — Assessment & Plan Note (Signed)
 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 Emphasized the importance of calling 911 after EpiPen  use. Prescribe one EpiPen  for emergency use.

## 2023-11-20 ENCOUNTER — Ambulatory Visit: Admitting: Nurse Practitioner

## 2023-11-20 ENCOUNTER — Ambulatory Visit: Payer: Self-pay | Admitting: Internal Medicine

## 2023-11-20 ENCOUNTER — Encounter: Payer: Self-pay | Admitting: Nurse Practitioner

## 2023-11-20 VITALS — BP 122/82 | HR 67 | Ht 72.0 in | Wt 185.2 lb

## 2023-11-20 DIAGNOSIS — J45909 Unspecified asthma, uncomplicated: Secondary | ICD-10-CM

## 2023-11-20 DIAGNOSIS — J4540 Moderate persistent asthma, uncomplicated: Secondary | ICD-10-CM

## 2023-11-20 DIAGNOSIS — N1831 Chronic kidney disease, stage 3a: Secondary | ICD-10-CM

## 2023-11-20 DIAGNOSIS — D721 Eosinophilia, unspecified: Secondary | ICD-10-CM

## 2023-11-20 LAB — COMPREHENSIVE METABOLIC PANEL WITH GFR
ALT: 18 U/L (ref 0–53)
AST: 16 U/L (ref 0–37)
Albumin: 4.6 g/dL (ref 3.5–5.2)
Alkaline Phosphatase: 46 U/L (ref 39–117)
BUN: 13 mg/dL (ref 6–23)
CO2: 27 meq/L (ref 19–32)
Calcium: 9.5 mg/dL (ref 8.4–10.5)
Chloride: 100 meq/L (ref 96–112)
Creatinine, Ser: 1.38 mg/dL (ref 0.40–1.50)
GFR: 57.38 mL/min — ABNORMAL LOW (ref >60.00)
Glucose, Bld: 99 mg/dL (ref 70–99)
Potassium: 4 meq/L (ref 3.5–5.1)
Sodium: 135 meq/L (ref 135–145)
Total Bilirubin: 0.6 mg/dL (ref 0.2–1.2)
Total Protein: 7.5 g/dL (ref 6.0–8.3)

## 2023-11-20 LAB — CBC WITH DIFFERENTIAL/PLATELET
Basophils Absolute: 0.1 K/uL (ref 0.0–0.1)
Basophils Relative: 0.8 % (ref 0.0–3.0)
Eosinophils Absolute: 0.5 K/uL (ref 0.0–0.7)
Eosinophils Relative: 7.1 % — ABNORMAL HIGH (ref 0.0–5.0)
HCT: 45.1 % (ref 39.0–52.0)
Hemoglobin: 15.2 g/dL (ref 13.0–17.0)
Lymphocytes Relative: 25.8 % (ref 12.0–46.0)
Lymphs Abs: 1.8 K/uL (ref 0.7–4.0)
MCHC: 33.7 g/dL (ref 30.0–36.0)
MCV: 87.6 fl (ref 78.0–100.0)
Monocytes Absolute: 0.5 K/uL (ref 0.1–1.0)
Monocytes Relative: 7.4 % (ref 3.0–12.0)
Neutro Abs: 4.1 K/uL (ref 1.4–7.7)
Neutrophils Relative %: 58.9 % (ref 43.0–77.0)
Platelets: 187 K/uL (ref 150.0–400.0)
RBC: 5.15 Mil/uL (ref 4.22–5.81)
RDW: 13.6 % (ref 11.5–15.5)
WBC: 7 K/uL (ref 4.0–10.5)

## 2023-11-20 MED ORDER — BUDESONIDE-FORMOTEROL FUMARATE 160-4.5 MCG/ACT IN AERO: 2.0000 | INHALATION_SPRAY | Freq: Two times a day (BID) | RESPIRATORY_TRACT | 6 refills | Status: AC

## 2023-11-20 NOTE — Progress Notes (Signed)
 @Patient  ID: Jose Crane, male    DOB: [birth year], 58 y.o.   MRN: [id number]  Chief Complaint  Patient presents with   Asthma    Medication management     Referring provider: Jesus Bernardino MATSU, MD  HPI: 58 year old male, never smoker followed for asthma. He is a patient of Dr. Chari and last seen in office 11/08/2022. Past medical history significant for HTN, atherosclerosis, HLD, hx of DVT.   TEST/EVENTS:  2019 spiro: FVC 98%, FEV1 115% 02/10/2022 CXR: clear lungs  11/08/2022: OV with Dr. Darlean. Maintained on symbicort . Using once a day at most. Moving to [facility] but will return once a year for annual follow up.   11/20/2023: Today - follow up Discussed the use of AI scribe software for clinical note transcription with the patient, who gave verbal consent to proceed.  History of Present Illness [name] is a 58 year old male with asthma who presents for a routine follow-up.  He uses Wixela as his inhaler due to insurance coverage changes from his previous medication, Symbicort . He uses the inhaler four to five times a week and has not needed to use albuterol . He has not required steroids or antibiotics for his breathing in the past year and has not visited urgent care for acute respiratory symptoms.  He experiences very little wheezing and no significant cough. His breathing does not impact his daily activities, and he plays tennis four to five times a week.  He has recently moved to [facility] and has not yet established care with a new primary care physician or pulmonologist. He continues to visit his current provider due to personal and business ties in the area.    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   Also has history of epinephrine  requiring asthma.    Asthma    Clotting disorder (HCC)    DVT (deep venous thrombosis) (HCC) 10/2008   Left leg; after achilles surgery; Repeat doppler 04-10-2009: Extensive chronic L leg DVT. Repeat Doppler 10-11-2009--resolution of DVT in CFV and SFV, residual clot in popliteal and peroneal veins   Hyperlipidemia    Hypertension    Ruptured, tendon, Achilles 07/2008   Thrush of mouth and esophagus (HCC) 07/10/2019   On and off, but hasn't came for a while  Seconday to severe asthma inhaler requirement-    Tobacco History: Social History   Tobacco Use  Smoking Status Never  Smokeless Tobacco Never   Counseling given: Not Answered   Outpatient Medications Prior to Visit  Medication Sig Dispense Refill   albuterol  (VENTOLIN  HFA) 108 (90 Base) MCG/ACT inhaler Inhale 2 puffs into the lungs every 6 (six) hours as needed for wheezing or shortness of breath. 6.7 each 1   aspirin  325 MG tablet Take 1 tablet (325 mg total) by mouth daily. 90 tablet 4   EPINEPHrine  (EPIPEN  2-PAK) 0.3 mg/0.3 mL IJ SOAJ injection Inject 0.3 mg into the muscle as needed for anaphylaxis. 1 each 11   fluticasone -salmeterol (WIXELA INHUB ) 250-50 MCG/ACT AEPB Inhale 1 puff into  the lungs in the morning and at bedtime. 60 each 5   losartan  (COZAAR ) 50 MG tablet Take 2 tablets (100 mg total) by mouth daily. 180 tablet 2   meclizine  (ANTIVERT ) 25 MG tablet Take 1 tablet (25 mg total) by mouth 3 (three) times daily as needed for dizziness. 30 tablet 1   ondansetron  (ZOFRAN ) 4 MG tablet Take 1 tablet (4 mg total) by mouth every 8 (eight) hours as needed for nausea or vomiting. 20 tablet 0   sildenafil  (VIAGRA ) 50 MG tablet Take 1 tablet (50 mg total) by mouth daily as needed for erectile dysfunction. 10 tablet 5    atorvastatin  (LIPITOR) 40 MG tablet Take 1 tablet (40 mg total) by mouth daily. (Patient not taking: Reported on 11/19/2023) 90 tablet 3   No facility-administered medications prior to visit.     Review of Systems:   Constitutional: No weight loss or gain, night sweats, fevers, chills, fatigue, or lassitude. HEENT: No headaches, nasal congestion, or post nasal drip Resp: No shortness of breath with exertion or at rest. No cough. No wheezing.   GI:  No heartburn, indigestion  Psych: No depression or anxiety. Mood stable.     Physical Exam:  BP 122/82 (BP Location: Left Arm, Patient Position: Sitting, Cuff Size: Normal)   Pulse 67   Ht 6' (1.829 m)   Wt 185 lb 3.2 oz (84 kg)   SpO2 95%   BMI 25.12 kg/m   GEN: Pleasant, interactive, well-appearing; in no acute distress HEENT:  Normocephalic and atraumatic. PERRLA. Sclera white. Nasal turbinates pink, moist and patent bilaterally. No rhinorrhea present. Oropharynx pink and moist, without exudate or edema. No lesions, ulcerations, or postnasal drip.  NECK:  Supple w/ fair ROM. No lymphadenopathy.   CV: RRR, no m/r/g, no peripheral edema. Pulses intact, +2 bilaterally. No cyanosis, pallor or clubbing. PULMONARY:  Unlabored, regular breathing. Clear bilaterally A&P w/o wheezes/rales/rhonchi. No accessory muscle use.  GI: BS present and normoactive. Soft, non-tender to palpation. No organomegaly or masses detected.  MSK: No erythema, warmth or tenderness. Cap refil <2 sec all extrem.  Neuro: A/Ox3. No focal deficits noted.   Skin: Warm, no lesions or rashe Psych: Normal affect and behavior. Judgement and thought content appropriate.     Lab Results:  CBC    Component Value Date/Time   WBC 7.0 11/19/2023 1631   RBC 5.15 11/19/2023 1631   HGB 15.2 11/19/2023 1631   HCT 45.1 11/19/2023 1631   PLT 187.0 11/19/2023 1631   MCV 87.6 11/19/2023 1631   MCH 29.2 02/10/2022 1511   MCHC 33.7 11/19/2023 1631   RDW 13.6 11/19/2023 1631    LYMPHSABS 1.8 11/19/2023 1631   MONOABS 0.5 11/19/2023 1631   EOSABS 0.5 11/19/2023 1631   BASOSABS 0.1 11/19/2023 1631    BMET    Component Value Date/Time   NA 135 11/19/2023 1631   K 4.0 11/19/2023 1631   CL 100 11/19/2023 1631   CO2 27 11/19/2023 1631   GLUCOSE 99 11/19/2023 1631   BUN 13 11/19/2023 1631   CREATININE 1.38 11/19/2023 1631   CALCIUM  9.5 11/19/2023 1631   GFRNONAA >60 02/10/2022 1511   GFRAA  08/17/2008 1145    >60        The eGFR has been calculated using the MDRD equation. This calculation has not been validated in all clinical situations. eGFR's persistently <60 mL/min signify possible Chronic Kidney Disease.    BNP No results found for: BNP   Imaging:  No results found.  Administration History     None           No data to display          No results found for: NITRICOXIDE      Assessment & Plan:   No problem-specific Assessment & Plan notes found for this encounter. Assessment and Plan Assessment & Plan Asthma, well-controlled Asthma is well-controlled with current medication regimen. He is using Wixela, switched from Symbicort  due to insurance coverage. He uses the inhaler four to five times a week and has not required albuterol , steroids, or antibiotics in the past year. No recent exacerbations or need for urgent care visits. No significant wheezing or cough, and he plays tennis four to five times a week without breathing issues. Physical examination reveals clear lung sounds. - Send prescription for Symbicort  to pharmacy and check insurance coverage. - If Symbicort  is not covered, refill Wixela. - Monitor asthma symptoms and inhaler use. - Schedule follow-up in one year unless symptoms worsen.    Advised if symptoms do not improve or worsen, to please contact office for sooner follow up or seek emergency care.   I spent 25 minutes of dedicated to the care of this patient on the date of this encounter to include  pre-visit review of records, face-to-face time with the patient discussing conditions above, post visit ordering of testing, clinical documentation with the electronic health record, making appropriate referrals as documented, and communicating necessary findings to members of the patients care team.  Comer LULLA Rouleau, NP 11/20/2023  Pt aware and understands NP's role.

## 2023-11-20 NOTE — Patient Instructions (Addendum)
-  Continue Albuterol  inhaler 2 puffs every 6 hours as needed for shortness of breath or wheezing. Notify if symptoms persist despite rescue inhaler/neb use. -We will see if we can get Symbicort  covered or the generic version - 2 puffs Twice daily. If not, let me know and I'll send Wixela back in. Brush tongue and rinse mouth afterwards   Follow up in one year with Dr. Darlean or NP. If symptoms worsen, please contact office for sooner follow up or seek emergency care.

## 2023-11-23 DIAGNOSIS — N1831 Chronic kidney disease, stage 3a: Secondary | ICD-10-CM | POA: Insufficient documentation
